# Patient Record
Sex: Male | Born: 1935 | Race: White | Hispanic: No | Marital: Married
Health system: Southern US, Community
[De-identification: ages and names within clinical notes are randomized; demographics above are authoritative.]

---

## 2006-11-08 ENCOUNTER — Ambulatory Visit: Payer: Self-pay | Admitting: Internal Medicine

## 2007-11-20 ENCOUNTER — Other Ambulatory Visit: Payer: Self-pay

## 2007-11-20 ENCOUNTER — Ambulatory Visit: Payer: Self-pay | Admitting: Family Medicine

## 2007-11-28 ENCOUNTER — Ambulatory Visit: Payer: Self-pay

## 2008-11-14 ENCOUNTER — Ambulatory Visit: Payer: Self-pay | Admitting: Internal Medicine

## 2009-04-09 ENCOUNTER — Encounter: Payer: Self-pay | Admitting: Cardiovascular Disease

## 2009-04-22 ENCOUNTER — Encounter: Payer: Self-pay | Admitting: Cardiovascular Disease

## 2009-05-23 ENCOUNTER — Encounter: Payer: Self-pay | Admitting: Cardiovascular Disease

## 2009-06-23 ENCOUNTER — Encounter: Payer: Self-pay | Admitting: Cardiovascular Disease

## 2009-07-21 ENCOUNTER — Encounter: Payer: Self-pay | Admitting: Cardiovascular Disease

## 2009-08-21 ENCOUNTER — Encounter: Payer: Self-pay | Admitting: Cardiovascular Disease

## 2009-09-20 ENCOUNTER — Encounter: Payer: Self-pay | Admitting: Cardiovascular Disease

## 2009-10-21 ENCOUNTER — Encounter: Payer: Self-pay | Admitting: Cardiovascular Disease

## 2009-11-20 ENCOUNTER — Encounter: Payer: Self-pay | Admitting: Cardiovascular Disease

## 2009-12-10 ENCOUNTER — Ambulatory Visit: Payer: Self-pay | Admitting: Family Medicine

## 2009-12-21 ENCOUNTER — Encounter: Payer: Self-pay | Admitting: Cardiovascular Disease

## 2010-01-03 ENCOUNTER — Emergency Department: Payer: Self-pay | Admitting: Unknown Physician Specialty

## 2010-02-01 ENCOUNTER — Encounter: Payer: Self-pay | Admitting: Cardiovascular Disease

## 2010-02-20 ENCOUNTER — Encounter: Payer: Self-pay | Admitting: Cardiovascular Disease

## 2010-03-23 ENCOUNTER — Encounter: Payer: Self-pay | Admitting: Cardiovascular Disease

## 2010-04-22 ENCOUNTER — Encounter: Payer: Self-pay | Admitting: Cardiovascular Disease

## 2010-05-23 ENCOUNTER — Encounter: Payer: Self-pay | Admitting: Cardiovascular Disease

## 2010-06-23 ENCOUNTER — Encounter: Payer: Self-pay | Admitting: Cardiovascular Disease

## 2010-07-22 ENCOUNTER — Encounter: Payer: Self-pay | Admitting: Cardiovascular Disease

## 2010-08-22 ENCOUNTER — Encounter: Payer: Self-pay | Admitting: Cardiovascular Disease

## 2010-10-07 ENCOUNTER — Encounter: Payer: Self-pay | Admitting: Cardiovascular Disease

## 2010-10-22 ENCOUNTER — Encounter: Payer: Self-pay | Admitting: Cardiovascular Disease

## 2010-11-19 ENCOUNTER — Ambulatory Visit: Payer: Self-pay | Admitting: Internal Medicine

## 2010-11-21 ENCOUNTER — Encounter: Payer: Self-pay | Admitting: Cardiovascular Disease

## 2010-12-22 ENCOUNTER — Encounter: Payer: Self-pay | Admitting: Cardiovascular Disease

## 2011-01-22 ENCOUNTER — Encounter: Payer: Self-pay | Admitting: Cardiovascular Disease

## 2011-02-21 ENCOUNTER — Encounter: Payer: Self-pay | Admitting: Cardiovascular Disease

## 2011-04-04 ENCOUNTER — Encounter: Payer: Self-pay | Admitting: Cardiovascular Disease

## 2011-04-23 ENCOUNTER — Ambulatory Visit: Payer: Self-pay | Admitting: Internal Medicine

## 2011-04-23 ENCOUNTER — Encounter: Payer: Self-pay | Admitting: Cardiovascular Disease

## 2011-05-11 ENCOUNTER — Inpatient Hospital Stay: Payer: Self-pay | Admitting: Student

## 2011-05-16 ENCOUNTER — Ambulatory Visit: Payer: Self-pay | Admitting: Internal Medicine

## 2011-05-24 ENCOUNTER — Ambulatory Visit: Payer: Self-pay | Admitting: Internal Medicine

## 2011-05-26 LAB — PROTIME-INR
INR: 2.2
Prothrombin Time: 24.2 secs — ABNORMAL HIGH (ref 11.5–14.7)

## 2011-05-31 LAB — CBC CANCER CENTER
Basophil #: 0 x10 3/mm (ref 0.0–0.1)
Eosinophil #: 0.1 x10 3/mm (ref 0.0–0.7)
HCT: 48.8 % (ref 40.0–52.0)
Lymphocyte #: 2.2 x10 3/mm (ref 1.0–3.6)
Lymphocyte %: 39.8 %
MCH: 31.8 pg (ref 26.0–34.0)
MCHC: 32.5 g/dL (ref 32.0–36.0)
MCV: 97.5 fL (ref 80–100)
Monocyte #: 0.6 x10 3/mm (ref 0.0–0.7)
Monocyte %: 10.5 %
Neutrophil #: 2.6 x10 3/mm (ref 1.4–6.5)
RDW: 14.3 % (ref 11.5–14.5)
WBC: 5.5 x10 3/mm (ref 3.8–10.6)

## 2011-05-31 LAB — PROTIME-INR: Prothrombin Time: 21.3 secs — ABNORMAL HIGH (ref 11.5–14.7)

## 2011-06-06 LAB — PROTIME-INR
INR: 2.3
Prothrombin Time: 25.6 secs — ABNORMAL HIGH (ref 11.5–14.7)

## 2011-06-24 ENCOUNTER — Ambulatory Visit: Payer: Self-pay | Admitting: Internal Medicine

## 2011-06-28 ENCOUNTER — Ambulatory Visit: Payer: Self-pay | Admitting: Internal Medicine

## 2011-06-28 LAB — PROTIME-INR
INR: 2.4
Prothrombin Time: 26.1 secs — ABNORMAL HIGH (ref 11.5–14.7)

## 2011-07-14 ENCOUNTER — Encounter: Payer: Self-pay | Admitting: Cardiovascular Disease

## 2011-07-14 LAB — PROTIME-INR: Prothrombin Time: 28.1 secs — ABNORMAL HIGH (ref 11.5–14.7)

## 2011-07-22 ENCOUNTER — Ambulatory Visit: Payer: Self-pay | Admitting: Internal Medicine

## 2011-07-26 ENCOUNTER — Encounter: Payer: Self-pay | Admitting: Cardiovascular Disease

## 2011-07-26 LAB — PROTIME-INR
INR: 2.3
Prothrombin Time: 25.7 secs — ABNORMAL HIGH (ref 11.5–14.7)

## 2011-07-26 LAB — CBC CANCER CENTER
Basophil %: 0.6 %
Eosinophil #: 0.1 x10 3/mm (ref 0.0–0.7)
Eosinophil %: 1.7 %
HCT: 47.5 % (ref 40.0–52.0)
HGB: 15.4 g/dL (ref 13.0–18.0)
Lymphocyte %: 32.1 %
MCHC: 32.4 g/dL (ref 32.0–36.0)
Monocyte #: 0.4 x10 3/mm (ref 0.0–0.7)
Neutrophil #: 2.1 x10 3/mm (ref 1.4–6.5)
Neutrophil %: 56.5 %
Platelet: 120 x10 3/mm — ABNORMAL LOW (ref 150–440)
RBC: 4.83 10*6/uL (ref 4.40–5.90)
WBC: 3.9 x10 3/mm (ref 3.8–10.6)

## 2011-08-22 ENCOUNTER — Encounter: Payer: Self-pay | Admitting: Cardiovascular Disease

## 2011-08-22 ENCOUNTER — Ambulatory Visit: Payer: Self-pay | Admitting: Internal Medicine

## 2011-09-20 LAB — CANCER CTR PLATELET CT: Platelet: 96 x10 3/mm — ABNORMAL LOW (ref 150–440)

## 2011-09-20 LAB — PROTIME-INR: INR: 1.9

## 2011-09-21 ENCOUNTER — Encounter: Payer: Self-pay | Admitting: Cardiovascular Disease

## 2011-09-21 ENCOUNTER — Ambulatory Visit: Payer: Self-pay | Admitting: Internal Medicine

## 2011-10-22 ENCOUNTER — Ambulatory Visit: Payer: Self-pay | Admitting: Internal Medicine

## 2011-10-26 ENCOUNTER — Ambulatory Visit: Payer: Self-pay | Admitting: Internal Medicine

## 2011-10-26 ENCOUNTER — Encounter: Payer: Self-pay | Admitting: Cardiovascular Disease

## 2011-10-26 LAB — PROTIME-INR: INR: 2.3

## 2011-11-21 ENCOUNTER — Ambulatory Visit: Payer: Self-pay | Admitting: Internal Medicine

## 2011-11-21 LAB — URINALYSIS, COMPLETE
Bacteria: NONE SEEN
Bilirubin,UR: NEGATIVE
Blood: NEGATIVE
Leukocyte Esterase: NEGATIVE
Nitrite: NEGATIVE
Ph: 5 (ref 4.5–8.0)
Protein: 30
RBC,UR: 1 /HPF (ref 0–5)
Specific Gravity: 1.015 (ref 1.003–1.030)
Squamous Epithelial: <1
WBC UR: 1 /HPF (ref 0–5)

## 2011-11-22 ENCOUNTER — Inpatient Hospital Stay: Payer: Self-pay | Admitting: Internal Medicine

## 2011-11-22 LAB — CBC
HCT: 43.4 % (ref 40.0–52.0)
MCH: 32.3 pg (ref 26.0–34.0)
MCHC: 32.1 g/dL (ref 32.0–36.0)
MCV: 100 fL (ref 80–100)
Platelet: 135 10*3/uL — ABNORMAL LOW (ref 150–440)
RBC: 4.32 10*6/uL — ABNORMAL LOW (ref 4.40–5.90)
WBC: 13.6 10*3/uL — ABNORMAL HIGH (ref 3.8–10.6)

## 2011-11-22 LAB — COMPREHENSIVE METABOLIC PANEL
Albumin: 2.9 g/dL — ABNORMAL LOW (ref 3.4–5.0)
Alkaline Phosphatase: 98 U/L (ref 50–136)
Anion Gap: 7 (ref 7–16)
BUN: 31 mg/dL — ABNORMAL HIGH (ref 7–18)
Bilirubin,Total: 1 mg/dL (ref 0.2–1.0)
Chloride: 101 mmol/L (ref 98–107)
Creatinine: 1.75 mg/dL — ABNORMAL HIGH (ref 0.60–1.30)
EGFR (African American): 40 — ABNORMAL LOW
Glucose: 165 mg/dL — ABNORMAL HIGH (ref 65–99)
Osmolality: 284 (ref 275–301)
Potassium: 5.4 mmol/L — ABNORMAL HIGH (ref 3.5–5.1)
Sodium: 137 mmol/L (ref 136–145)
Total Protein: 6.7 g/dL (ref 6.4–8.2)

## 2011-11-22 LAB — PROTIME-INR
INR: 3.5
INR: 3.6
Prothrombin Time: 34.9 secs — ABNORMAL HIGH (ref 11.5–14.7)
Prothrombin Time: 36.2 secs — ABNORMAL HIGH (ref 11.5–14.7)

## 2011-11-22 LAB — CK TOTAL AND CKMB (NOT AT ARMC)
CK, Total: 29 U/L — ABNORMAL LOW (ref 35–232)
CK-MB: 1.9 ng/mL (ref 0.5–3.6)

## 2011-11-22 LAB — DIGOXIN LEVEL: Digoxin: 0.8 ng/mL

## 2011-11-22 LAB — APTT: Activated PTT: 44.2 secs — ABNORMAL HIGH (ref 23.6–35.9)

## 2011-11-22 LAB — TROPONIN I
Troponin-I: 0.21 ng/mL — ABNORMAL HIGH
Troponin-I: 1.66 ng/mL — ABNORMAL HIGH
Troponin-I: 1.9 ng/mL — ABNORMAL HIGH

## 2011-11-22 LAB — PRO B NATRIURETIC PEPTIDE: B-Type Natriuretic Peptide: 6800 pg/mL — ABNORMAL HIGH (ref 0–450)

## 2011-11-22 LAB — PHOSPHORUS: Phosphorus: 4 mg/dL (ref 2.5–4.9)

## 2011-11-22 LAB — MAGNESIUM: Magnesium: 1.5 mg/dL — ABNORMAL LOW

## 2011-11-23 LAB — COMPREHENSIVE METABOLIC PANEL
Albumin: 2.7 g/dL — ABNORMAL LOW (ref 3.4–5.0)
BUN: 43 mg/dL — ABNORMAL HIGH (ref 7–18)
Bilirubin,Total: 0.6 mg/dL (ref 0.2–1.0)
Chloride: 100 mmol/L (ref 98–107)
Creatinine: 1.74 mg/dL — ABNORMAL HIGH (ref 0.60–1.30)
EGFR (African American): 41 — ABNORMAL LOW
Glucose: 193 mg/dL — ABNORMAL HIGH (ref 65–99)
Potassium: 4.2 mmol/L (ref 3.5–5.1)
SGOT(AST): 50 U/L — ABNORMAL HIGH (ref 15–37)
SGPT (ALT): 22 U/L
Total Protein: 6.6 g/dL (ref 6.4–8.2)

## 2011-11-23 LAB — CBC WITH DIFFERENTIAL/PLATELET
Basophil %: 0 %
Eosinophil #: 0 10*3/uL (ref 0.0–0.7)
HGB: 13.4 g/dL (ref 13.0–18.0)
Lymphocyte #: 1 10*3/uL (ref 1.0–3.6)
Lymphocyte %: 6.6 %
MCH: 32.6 pg (ref 26.0–34.0)
MCHC: 33 g/dL (ref 32.0–36.0)
MCV: 99 fL (ref 80–100)
Monocyte #: 1 x10 3/mm (ref 0.2–1.0)
Neutrophil %: 87.2 %
Platelet: 153 10*3/uL (ref 150–440)

## 2011-11-23 LAB — MAGNESIUM: Magnesium: 2.1 mg/dL

## 2011-11-23 LAB — PROTIME-INR
INR: 3.2
Prothrombin Time: 33.1 secs — ABNORMAL HIGH (ref 11.5–14.7)

## 2011-11-24 LAB — PROTIME-INR: Prothrombin Time: 27.1 secs — ABNORMAL HIGH (ref 11.5–14.7)

## 2011-11-25 LAB — PROTIME-INR
INR: 2.2
Prothrombin Time: 24.5 secs — ABNORMAL HIGH (ref 11.5–14.7)

## 2011-11-26 LAB — VANCOMYCIN, TROUGH: Vancomycin, Trough: 9 ug/mL — ABNORMAL LOW (ref 10–20)

## 2011-11-27 LAB — MAGNESIUM: Magnesium: 1.8 mg/dL

## 2011-11-27 LAB — CBC WITH DIFFERENTIAL/PLATELET
Basophil %: 0.2 %
Eosinophil #: 0.1 10*3/uL (ref 0.0–0.7)
Eosinophil %: 2.4 %
HCT: 38.8 % — ABNORMAL LOW (ref 40.0–52.0)
HGB: 12.8 g/dL — ABNORMAL LOW (ref 13.0–18.0)
MCH: 32.8 pg (ref 26.0–34.0)
MCV: 99 fL (ref 80–100)
Monocyte #: 0.6 x10 3/mm (ref 0.2–1.0)
Monocyte %: 12.3 %
Neutrophil #: 3 10*3/uL (ref 1.4–6.5)
RBC: 3.9 10*6/uL — ABNORMAL LOW (ref 4.40–5.90)
WBC: 4.6 10*3/uL (ref 3.8–10.6)

## 2011-11-27 LAB — BASIC METABOLIC PANEL
Anion Gap: 7 (ref 7–16)
BUN: 42 mg/dL — ABNORMAL HIGH (ref 7–18)
Calcium, Total: 8.1 mg/dL — ABNORMAL LOW (ref 8.5–10.1)
Co2: 31 mmol/L (ref 21–32)
Creatinine: 1.32 mg/dL — ABNORMAL HIGH (ref 0.60–1.30)
EGFR (African American): 57 — ABNORMAL LOW
Glucose: 90 mg/dL (ref 65–99)
Potassium: 4.3 mmol/L (ref 3.5–5.1)
Sodium: 140 mmol/L (ref 136–145)

## 2011-11-28 LAB — BASIC METABOLIC PANEL
BUN: 42 mg/dL — ABNORMAL HIGH (ref 7–18)
Co2: 34 mmol/L — ABNORMAL HIGH (ref 21–32)
Creatinine: 1.34 mg/dL — ABNORMAL HIGH (ref 0.60–1.30)
EGFR (African American): 56 — ABNORMAL LOW
EGFR (Non-African Amer.): 48 — ABNORMAL LOW
Glucose: 87 mg/dL (ref 65–99)
Osmolality: 293 (ref 275–301)
Sodium: 142 mmol/L (ref 136–145)

## 2011-11-28 LAB — PRO B NATRIURETIC PEPTIDE: B-Type Natriuretic Peptide: 2778 pg/mL — ABNORMAL HIGH (ref 0–450)

## 2011-11-29 LAB — PROTIME-INR: Prothrombin Time: 18.6 secs — ABNORMAL HIGH (ref 11.5–14.7)

## 2011-11-30 LAB — BASIC METABOLIC PANEL
Anion Gap: 6 — ABNORMAL LOW (ref 7–16)
Calcium, Total: 8.1 mg/dL — ABNORMAL LOW (ref 8.5–10.1)
Chloride: 105 mmol/L (ref 98–107)
Co2: 31 mmol/L (ref 21–32)
Creatinine: 1.18 mg/dL (ref 0.60–1.30)
EGFR (African American): >60
EGFR (Non-African Amer.): 56 — ABNORMAL LOW
Glucose: 99 mg/dL (ref 65–99)
Osmolality: 292 (ref 275–301)
Sodium: 142 mmol/L (ref 136–145)

## 2011-12-01 ENCOUNTER — Encounter: Payer: Self-pay | Admitting: Cardiovascular Disease

## 2011-12-01 ENCOUNTER — Ambulatory Visit: Payer: Self-pay | Admitting: Internal Medicine

## 2011-12-22 ENCOUNTER — Ambulatory Visit: Payer: Self-pay | Admitting: Internal Medicine

## 2011-12-27 ENCOUNTER — Ambulatory Visit: Payer: Self-pay | Admitting: Internal Medicine

## 2011-12-27 LAB — CANCER CTR PLATELET CT: Platelet: 134 x10 3/mm — ABNORMAL LOW (ref 150–440)

## 2012-01-22 ENCOUNTER — Ambulatory Visit: Payer: Self-pay | Admitting: Oncology

## 2012-01-22 ENCOUNTER — Ambulatory Visit: Payer: Self-pay | Admitting: Internal Medicine

## 2012-02-06 ENCOUNTER — Inpatient Hospital Stay: Payer: Self-pay | Admitting: Specialist

## 2012-02-06 LAB — BODY FLUID CELL COUNT WITH DIFFERENTIAL
Eosinophil: 1 %
Lymphocytes: 80 %
Neutrophils: 2 %
Nucleated Cell Count: 478 /mm3

## 2012-02-06 LAB — GLUCOSE, SEROUS FLUID: Glucose, Body Fluid: 114 mg/dL

## 2012-02-06 LAB — PROTIME-INR
INR: 1.1
Prothrombin Time: 14.8 secs — ABNORMAL HIGH (ref 11.5–14.7)

## 2012-02-06 LAB — LACTATE DEHYDROGENASE, PLEURAL OR PERITONEAL FLUID: LDH, Body Fluid: 93 U/L

## 2012-02-06 LAB — PROTEIN, BODY FLUID: Protein, Body Fluid: 3.2 g/dL

## 2012-02-08 LAB — CBC WITH DIFFERENTIAL/PLATELET
Basophil #: 0 10*3/uL (ref 0.0–0.1)
Basophil %: 0.7 %
HCT: 39.7 % — ABNORMAL LOW (ref 40.0–52.0)
HGB: 13.3 g/dL (ref 13.0–18.0)
Lymphocyte %: 22.2 %
Monocyte #: 0.6 x10 3/mm (ref 0.2–1.0)
Monocyte %: 11.8 %
Neutrophil #: 3.1 10*3/uL (ref 1.4–6.5)
Neutrophil %: 63.2 %
WBC: 4.9 10*3/uL (ref 3.8–10.6)

## 2012-02-08 LAB — COMPREHENSIVE METABOLIC PANEL
Alkaline Phosphatase: 105 U/L (ref 50–136)
Anion Gap: 5 — ABNORMAL LOW (ref 7–16)
Bilirubin,Total: 0.6 mg/dL (ref 0.2–1.0)
Calcium, Total: 8.8 mg/dL (ref 8.5–10.1)
Chloride: 100 mmol/L (ref 98–107)
Co2: 34 mmol/L — ABNORMAL HIGH (ref 21–32)
Creatinine: 1.58 mg/dL — ABNORMAL HIGH (ref 0.60–1.30)
EGFR (African American): 46 — ABNORMAL LOW
EGFR (Non-African Amer.): 39 — ABNORMAL LOW
SGOT(AST): 22 U/L (ref 15–37)
SGPT (ALT): 14 U/L (ref 12–78)

## 2012-02-10 LAB — BODY FLUID CULTURE

## 2012-02-21 ENCOUNTER — Ambulatory Visit: Payer: Self-pay | Admitting: Internal Medicine

## 2012-03-01 ENCOUNTER — Ambulatory Visit: Payer: Self-pay | Admitting: Neurology

## 2012-03-21 ENCOUNTER — Inpatient Hospital Stay: Payer: Self-pay | Admitting: Internal Medicine

## 2012-03-21 LAB — COMPREHENSIVE METABOLIC PANEL
Albumin: 2.5 g/dL — ABNORMAL LOW (ref 3.4–5.0)
Alkaline Phosphatase: 97 U/L (ref 50–136)
Anion Gap: 7 (ref 7–16)
BUN: 20 mg/dL — ABNORMAL HIGH (ref 7–18)
Calcium, Total: 8.6 mg/dL (ref 8.5–10.1)
EGFR (African American): 53 — ABNORMAL LOW
EGFR (Non-African Amer.): 45 — ABNORMAL LOW
Glucose: 112 mg/dL — ABNORMAL HIGH (ref 65–99)
Osmolality: 283 (ref 275–301)
Potassium: 3.9 mmol/L (ref 3.5–5.1)
SGOT(AST): 16 U/L (ref 15–37)
Total Protein: 6.8 g/dL (ref 6.4–8.2)

## 2012-03-21 LAB — DIGOXIN LEVEL: Digoxin: 1.3 ng/mL

## 2012-03-21 LAB — URINALYSIS, COMPLETE
Glucose,UR: NEGATIVE mg/dL (ref 0–75)
Hyaline Cast: 37
Leukocyte Esterase: NEGATIVE
Nitrite: NEGATIVE
Ph: 5 (ref 4.5–8.0)
Protein: 30
RBC,UR: 27 /HPF (ref 0–5)
Specific Gravity: 1.025 (ref 1.003–1.030)
WBC UR: 11 /HPF (ref 0–5)

## 2012-03-21 LAB — CK TOTAL AND CKMB (NOT AT ARMC)
CK, Total: 39 U/L (ref 35–232)
CK-MB: 1.3 ng/mL (ref 0.5–3.6)

## 2012-03-21 LAB — CBC
HCT: 35.7 % — ABNORMAL LOW (ref 40.0–52.0)
MCH: 31.4 pg (ref 26.0–34.0)
MCV: 95 fL (ref 80–100)
Platelet: 151 10*3/uL (ref 150–440)
RBC: 3.76 10*6/uL — ABNORMAL LOW (ref 4.40–5.90)
RDW: 16 % — ABNORMAL HIGH (ref 11.5–14.5)

## 2012-03-21 LAB — PROTIME-INR
INR: 2.9
Prothrombin Time: 30.8 secs — ABNORMAL HIGH (ref 11.5–14.7)

## 2012-03-21 LAB — TROPONIN I: Troponin-I: 0.06 ng/mL — ABNORMAL HIGH

## 2012-03-22 LAB — CBC WITH DIFFERENTIAL/PLATELET
Basophil #: 0 10*3/uL (ref 0.0–0.1)
Eosinophil #: 0 10*3/uL (ref 0.0–0.7)
Lymphocyte #: 0.5 10*3/uL — ABNORMAL LOW (ref 1.0–3.6)
MCV: 94 fL (ref 80–100)
Monocyte %: 1.7 %
Neutrophil #: 3.4 10*3/uL (ref 1.4–6.5)
Platelet: 134 10*3/uL — ABNORMAL LOW (ref 150–440)
RBC: 3.39 10*6/uL — ABNORMAL LOW (ref 4.40–5.90)
RDW: 16 % — ABNORMAL HIGH (ref 11.5–14.5)
WBC: 3.9 10*3/uL (ref 3.8–10.6)

## 2012-03-22 LAB — PROTIME-INR
INR: 3.3
Prothrombin Time: 33.3 secs — ABNORMAL HIGH (ref 11.5–14.7)

## 2012-03-22 LAB — BASIC METABOLIC PANEL
Anion Gap: 6 — ABNORMAL LOW (ref 7–16)
BUN: 22 mg/dL — ABNORMAL HIGH (ref 7–18)
Chloride: 102 mmol/L (ref 98–107)
Creatinine: 1.23 mg/dL (ref 0.60–1.30)
EGFR (Non-African Amer.): 53 — ABNORMAL LOW
Glucose: 148 mg/dL — ABNORMAL HIGH (ref 65–99)
Osmolality: 287 (ref 275–301)

## 2012-03-22 LAB — LIPID PANEL
Cholesterol: 115 mg/dL (ref 0–200)
HDL Cholesterol: 53 mg/dL (ref 40–60)
Ldl Cholesterol, Calc: 48 mg/dL (ref 0–100)
Triglycerides: 70 mg/dL (ref 0–200)
VLDL Cholesterol, Calc: 14 mg/dL (ref 5–40)

## 2012-03-23 ENCOUNTER — Ambulatory Visit: Payer: Self-pay | Admitting: Internal Medicine

## 2012-03-24 LAB — PROTIME-INR
INR: 3.4
Prothrombin Time: 34.2 secs — ABNORMAL HIGH (ref 11.5–14.7)

## 2012-03-25 LAB — BASIC METABOLIC PANEL
Anion Gap: 10 (ref 7–16)
BUN: 41 mg/dL — ABNORMAL HIGH (ref 7–18)
Chloride: 101 mmol/L (ref 98–107)
Co2: 32 mmol/L (ref 21–32)
Osmolality: 299 (ref 275–301)
Potassium: 4.2 mmol/L (ref 3.5–5.1)
Sodium: 143 mmol/L (ref 136–145)

## 2012-03-25 LAB — CBC WITH DIFFERENTIAL/PLATELET
Basophil #: 0 10*3/uL (ref 0.0–0.1)
Basophil %: 0 %
Eosinophil #: 0 10*3/uL (ref 0.0–0.7)
Eosinophil %: 0 %
HGB: 10.7 g/dL — ABNORMAL LOW (ref 13.0–18.0)
Lymphocyte %: 2.8 %
MCH: 31.5 pg (ref 26.0–34.0)
MCHC: 33.5 g/dL (ref 32.0–36.0)
Monocyte #: 0.1 x10 3/mm — ABNORMAL LOW (ref 0.2–1.0)
Neutrophil %: 95.1 %
Platelet: 147 10*3/uL — ABNORMAL LOW (ref 150–440)
RDW: 15.7 % — ABNORMAL HIGH (ref 11.5–14.5)
WBC: 6.6 10*3/uL (ref 3.8–10.6)

## 2012-03-25 LAB — PROTIME-INR: Prothrombin Time: 29.1 secs — ABNORMAL HIGH (ref 11.5–14.7)

## 2012-03-26 LAB — CBC WITH DIFFERENTIAL/PLATELET
Basophil #: 0 10*3/uL (ref 0.0–0.1)
Basophil %: 0.1 %
HCT: 31.9 % — ABNORMAL LOW (ref 40.0–52.0)
Lymphocyte #: 0.2 10*3/uL — ABNORMAL LOW (ref 1.0–3.6)
Lymphocyte %: 3.2 %
MCH: 31.4 pg (ref 26.0–34.0)
MCHC: 33.3 g/dL (ref 32.0–36.0)
Monocyte #: 0.1 x10 3/mm — ABNORMAL LOW (ref 0.2–1.0)
Neutrophil #: 5.9 10*3/uL (ref 1.4–6.5)
Neutrophil %: 94.6 %
Platelet: 136 10*3/uL — ABNORMAL LOW (ref 150–440)
RDW: 16.1 % — ABNORMAL HIGH (ref 11.5–14.5)

## 2012-03-26 LAB — BASIC METABOLIC PANEL
Anion Gap: 9 (ref 7–16)
Calcium, Total: 8 mg/dL — ABNORMAL LOW (ref 8.5–10.1)
Chloride: 100 mmol/L (ref 98–107)
EGFR (Non-African Amer.): 42 — ABNORMAL LOW
Glucose: 165 mg/dL — ABNORMAL HIGH (ref 65–99)
Osmolality: 301 (ref 275–301)
Sodium: 143 mmol/L (ref 136–145)

## 2012-03-27 LAB — CULTURE, BLOOD (SINGLE)

## 2012-04-22 ENCOUNTER — Ambulatory Visit: Payer: Self-pay | Admitting: Internal Medicine

## 2012-04-22 DEATH — deceased

## 2012-08-11 IMAGING — CR DG CHEST 1V PORT
1 series · 1 of 1 positions shown · non-contrast
Comparison: none

REASON FOR EXAM: sob
COMMENTS:

[portable]
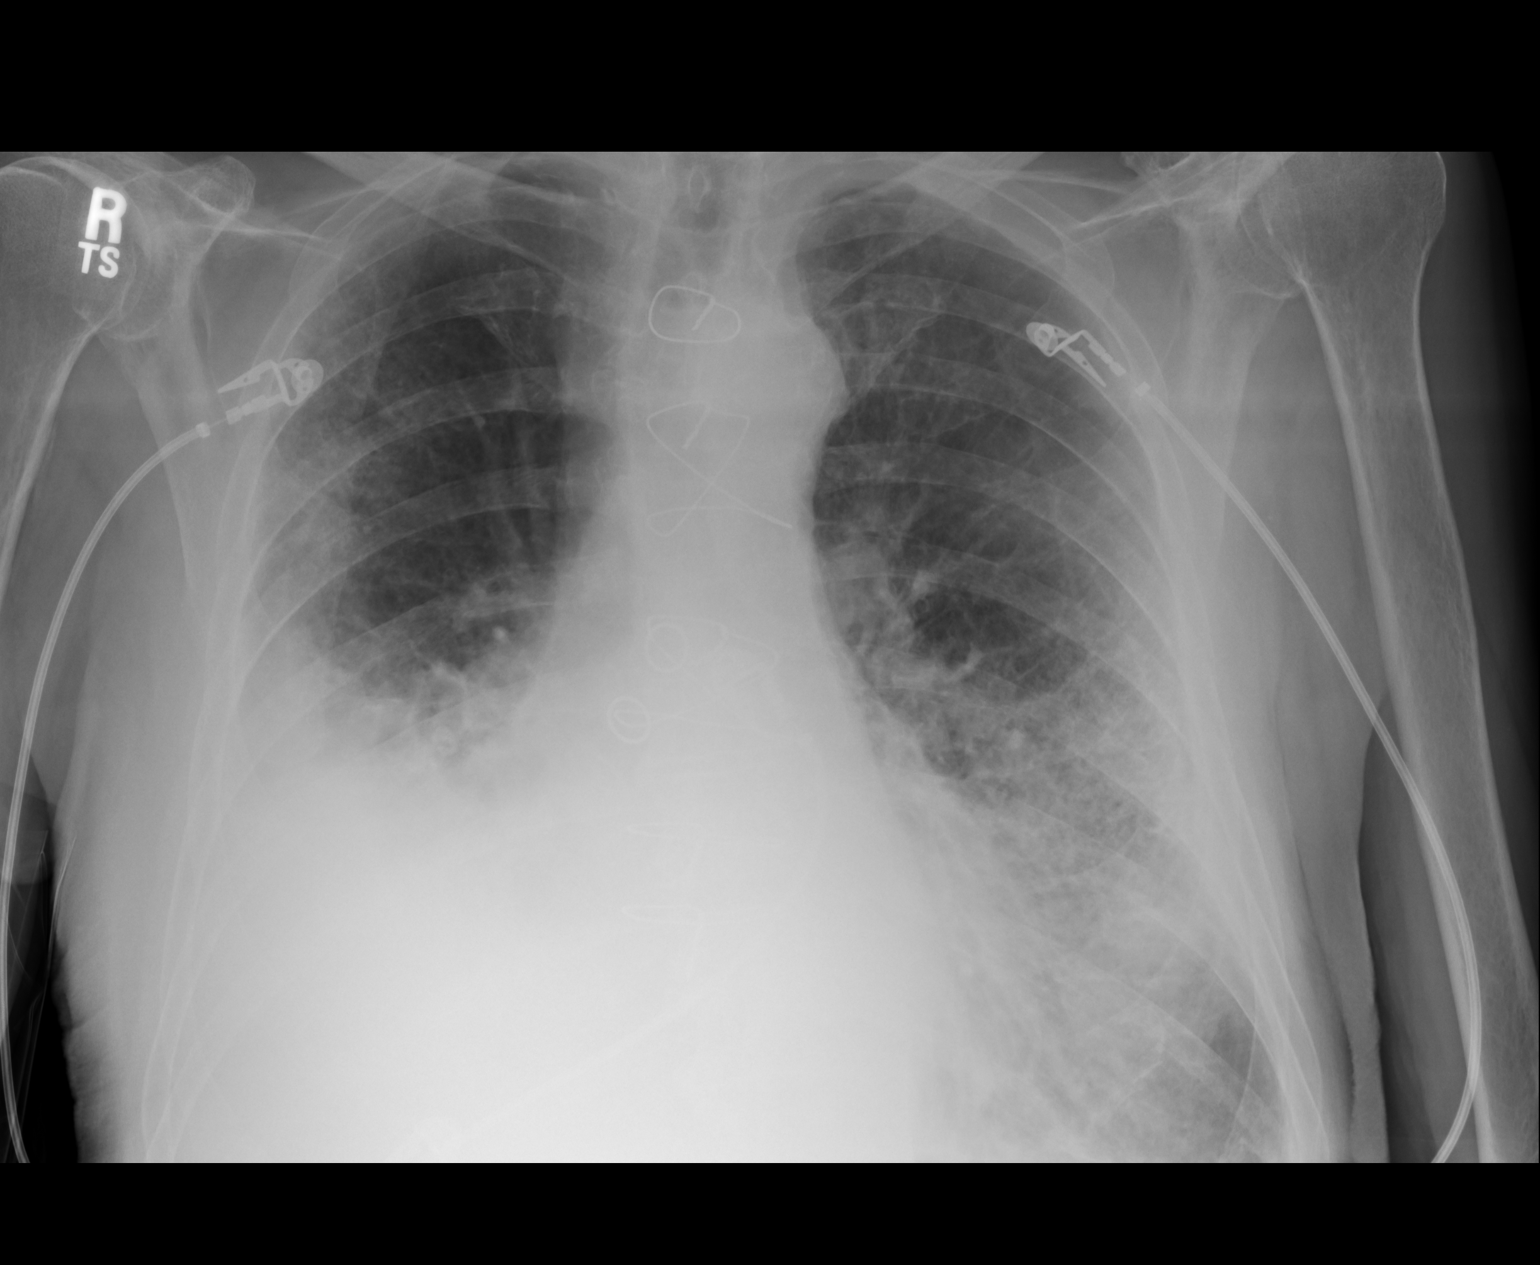

[1 of 1 positions shown; findings below may reference images not displayed]

PROCEDURE:     DXR - DXR PORTABLE CHEST SINGLE VIEW  - November 21, 2011 [DATE]

RESULT:     Frontal view of the chest is performed. Comparison is made to a
prior two view chest exam dated 10/17/2011.

Persistent consolidative density projects in the region of the right middle
lobe and right lung base. There has been interval development of increased
density within the region of the right middle lobe. The most inferior
portion of the lung bases is not included on the study. An area of
ill-defined increased density projects in the region of the right upper
lobe. There is prominence of the interstitial markings and peribronchial
cuffing. The cardiac silhouette is within normal limits. The visualized bony
skeleton is unremarkable.
IMPRESSION: 1.  Interstitial infiltrate, edematous versus infectious or inflammatory.
2.  Asymmetric edema versus infiltrates within the lung bases and possible
right effusion. Surveillance evaluation is recommended status post
appropriate therapeutic regiment.

## 2012-08-16 IMAGING — CR DG CHEST 1V
1 series · 1 of 1 positions shown · non-contrast
Comparison: none

REASON FOR EXAM: leukocytosis, r/o PNA
COMMENTS:

PROCEDURE:     DXR - DXR CHEST 1 VIEWAP OR PA  - November 26, 2011 [DATE]
RESULT:     Comparison: 11/21/2011

[ap]
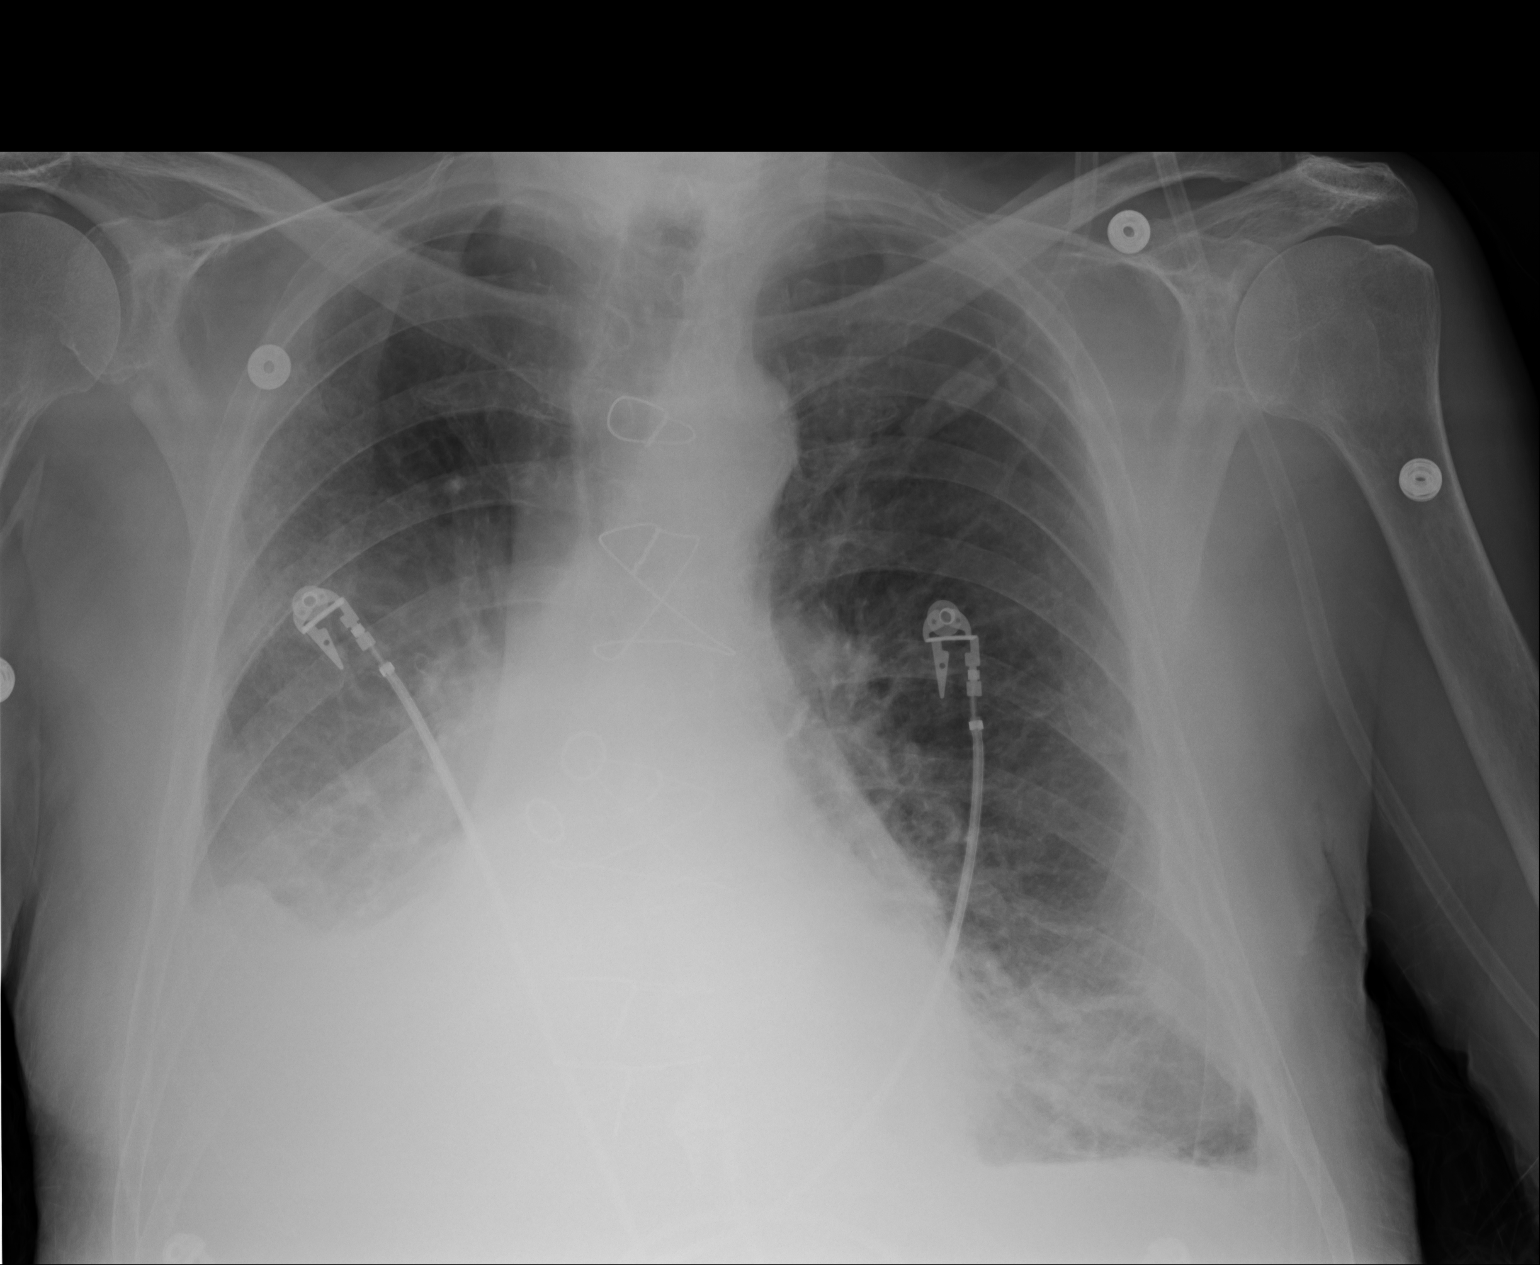

[1 of 1 positions shown; findings below may reference images not displayed]

FINDINGS: Single portable AP chest radiograph is provided. There is right lower lobe
airspace disease. There bilateral trace pleural effusions. There is hazy
left lower lobe airspace disease. There is bilateral interstitial
thickening. There is evidence of prior CABG. The heart size is normal. The
osseous structures are unremarkable.
IMPRESSION: Bibasilar airspace disease and bilateral small pleural effusions, right
greater than left. There is bilateral interstitial evening. The overall
appearance may be secondary to pulmonary edema versus multilobar pneumonia.

[REDACTED]

## 2012-10-27 IMAGING — XA IR PERC PLEURAL DRAIN W/INDWELL CATH W/IMG GUIDE
1 series · 2 of 2 positions shown · non-contrast
Comparison: none

REASON FOR EXAM: Right sided chest tube placement for penoumothorax
COMMENTS:

[Series 1: run · 2 of 2 slices shown]
[im 1/2]
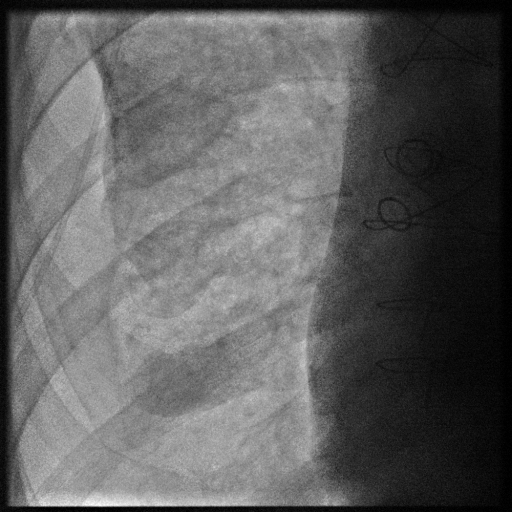
[im 2/2]
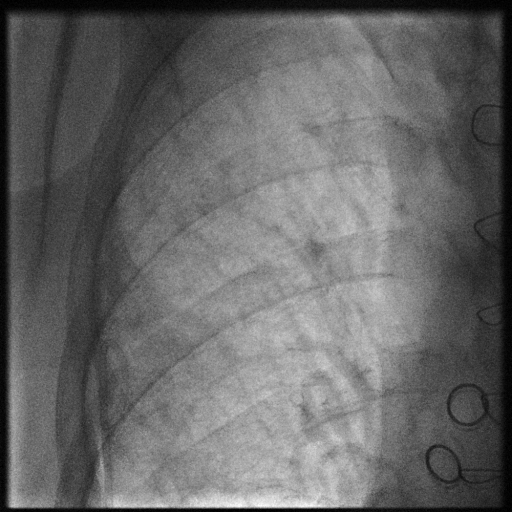

[2 of 2 positions shown; findings below may reference images not displayed]

PROCEDURE:     VAS - VAS TUBE INST CHEST RT VIA FLUOR  - February 06, 2012  [DATE]

RESULT:     After discussing the risk and benefits with the patient informed
consent was obtained. The right chest tube sterilely prepped and draped.
Following administration of 1% lidocaine , 18-gauge needle was advanced into
the pleural space , Amplatz extra-stiff wire placed and a [DATE] French pigtail
chest tube placed without complication. Chest tube placed to a low wall
suction.
IMPRESSION: Successful right chest tube placement.

## 2012-10-29 IMAGING — CR DG CHEST 1V PORT
1 series · 1 of 1 positions shown · non-contrast
Comparison: none

REASON FOR EXAM: follow up pneumothorax
COMMENTS:

[ap]
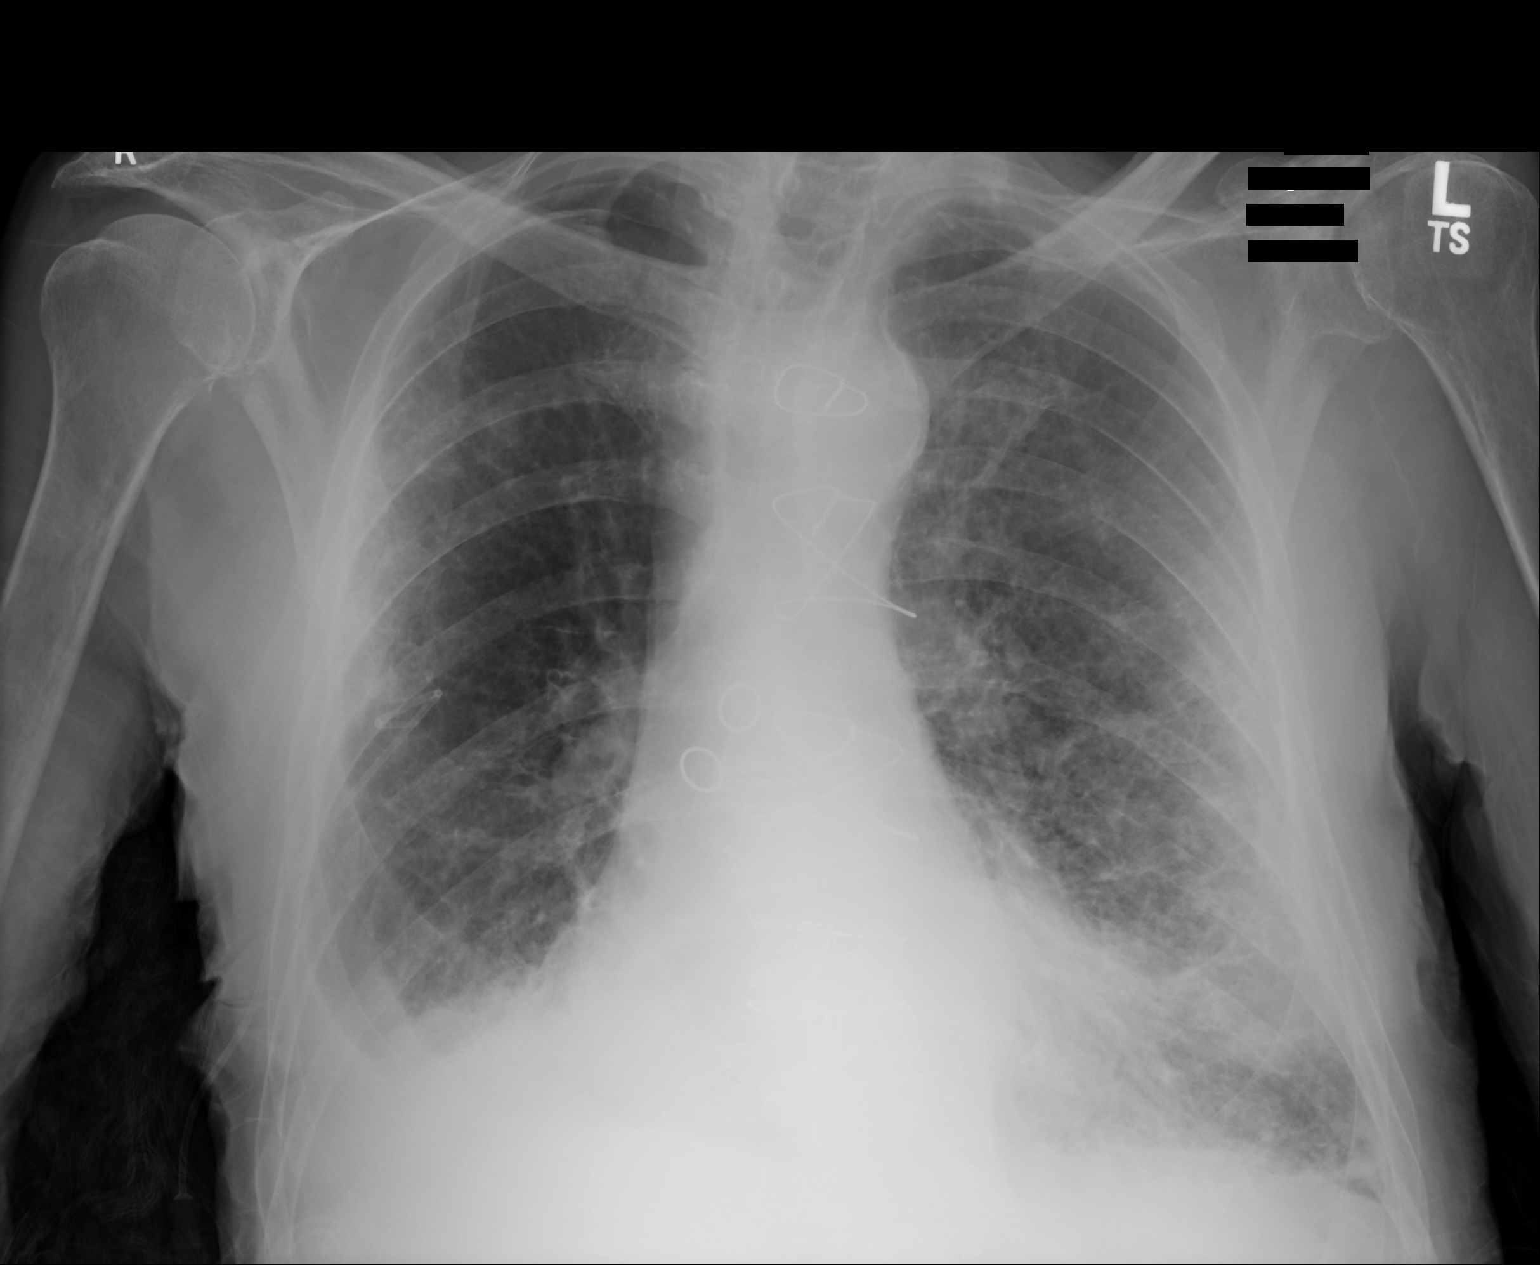

[1 of 1 positions shown; findings below may reference images not displayed]

PROCEDURE:     DXR - DXR PORTABLE CHEST SINGLE VIEW  - February 08, 2012  [DATE]

RESULT:     Comparison is made to the study 07 February, 2012.

Persistent patchy increased density is seen in the mid and lower left lung
regions and at the right lung base. There is pleural thickening laterally in
the right hemithorax. The patient is rotated toward the left. Sternotomy
wires are present. CABG markers are demonstrated. The heart size appears
normal. Small effusions are present. Small caliber right-sided chest tube is
present. Small apical pneumothorax is present on the right.
IMPRESSION: 1. The right chest tube remains in place with a small right apical
pneumothorax persisting. Other pulmonary changes appear to be stable.

[REDACTED]

## 2012-12-10 IMAGING — CR DG CHEST 1V PORT
1 series · 2 of 2 positions shown · non-contrast
Comparison: none

REASON FOR EXAM: dyspnea
COMMENTS:

PROCEDURE:     DXR - DXR PORTABLE CHEST SINGLE VIEW  - March 21, 2012  [DATE]
RESULT:     Comparison is made to a prior study dated 02/09/2012.

[Series 1: ap · 0.17mm/px · 2 of 2 slices shown]
[im 1/2]
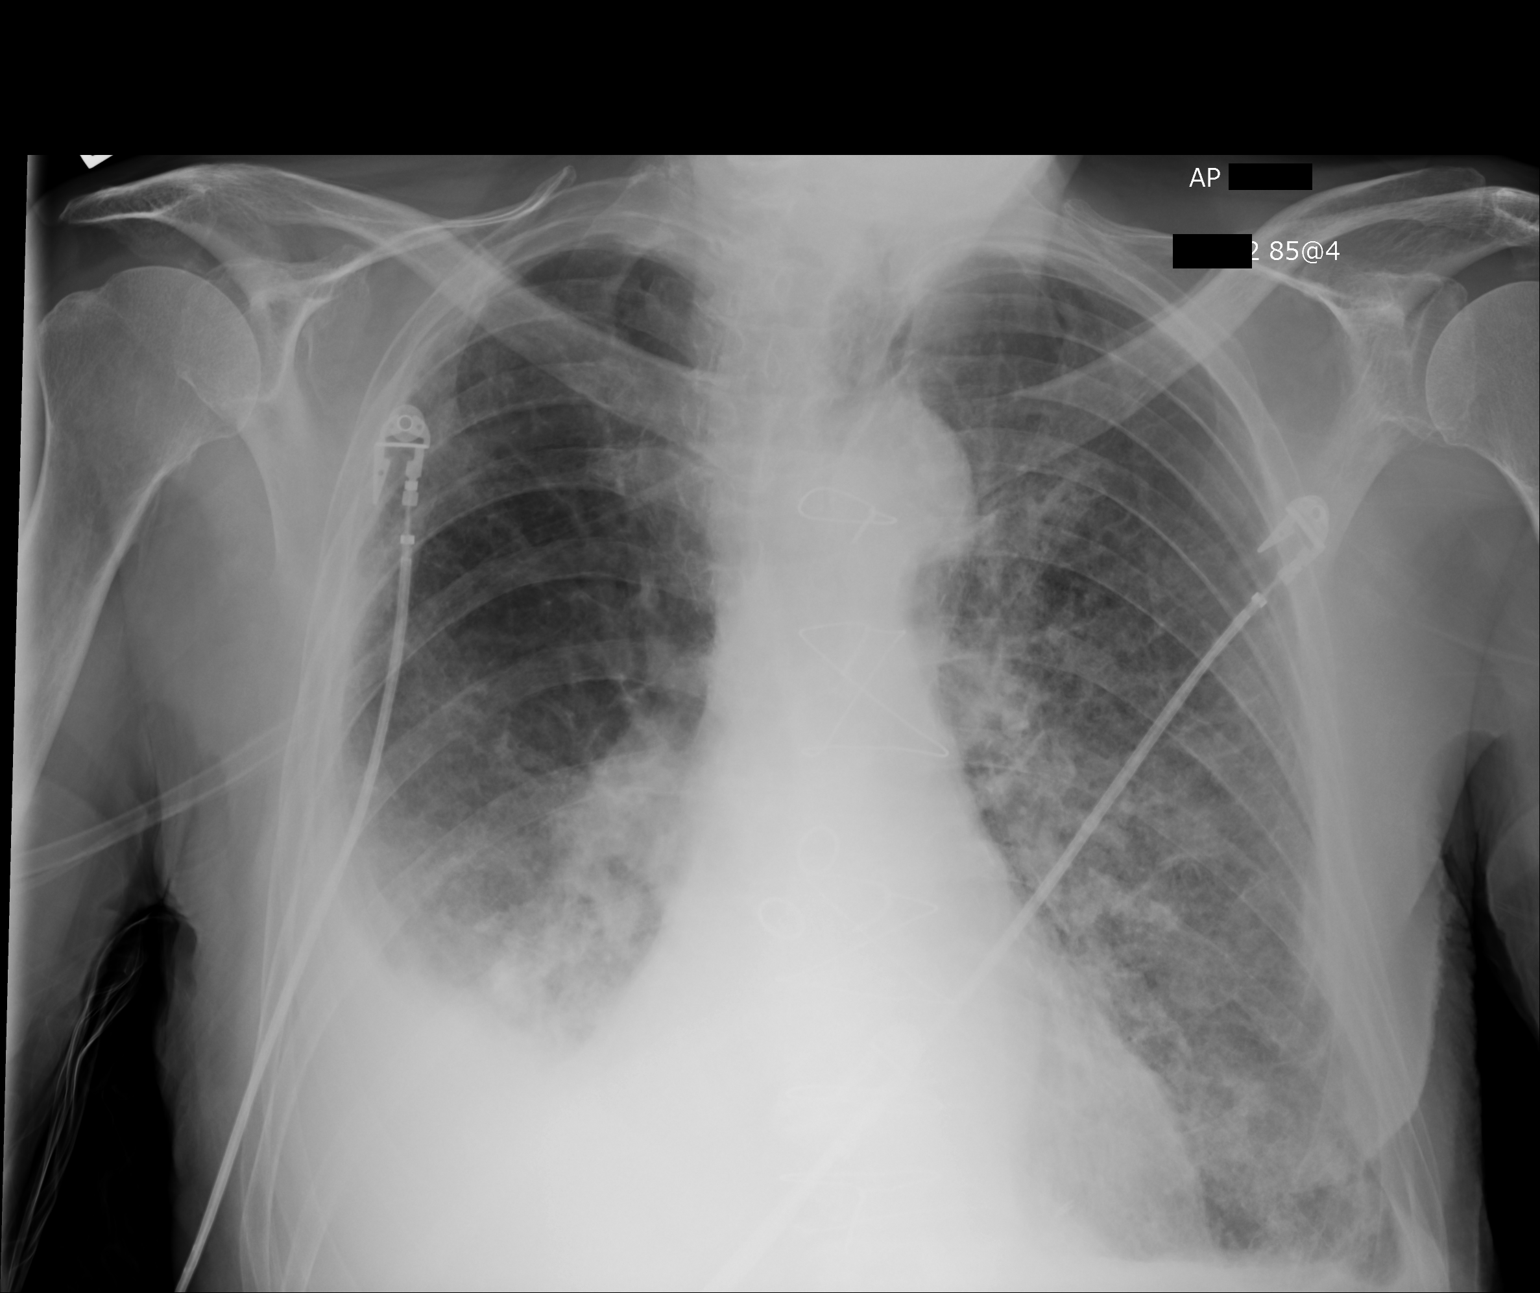
[im 2/2]
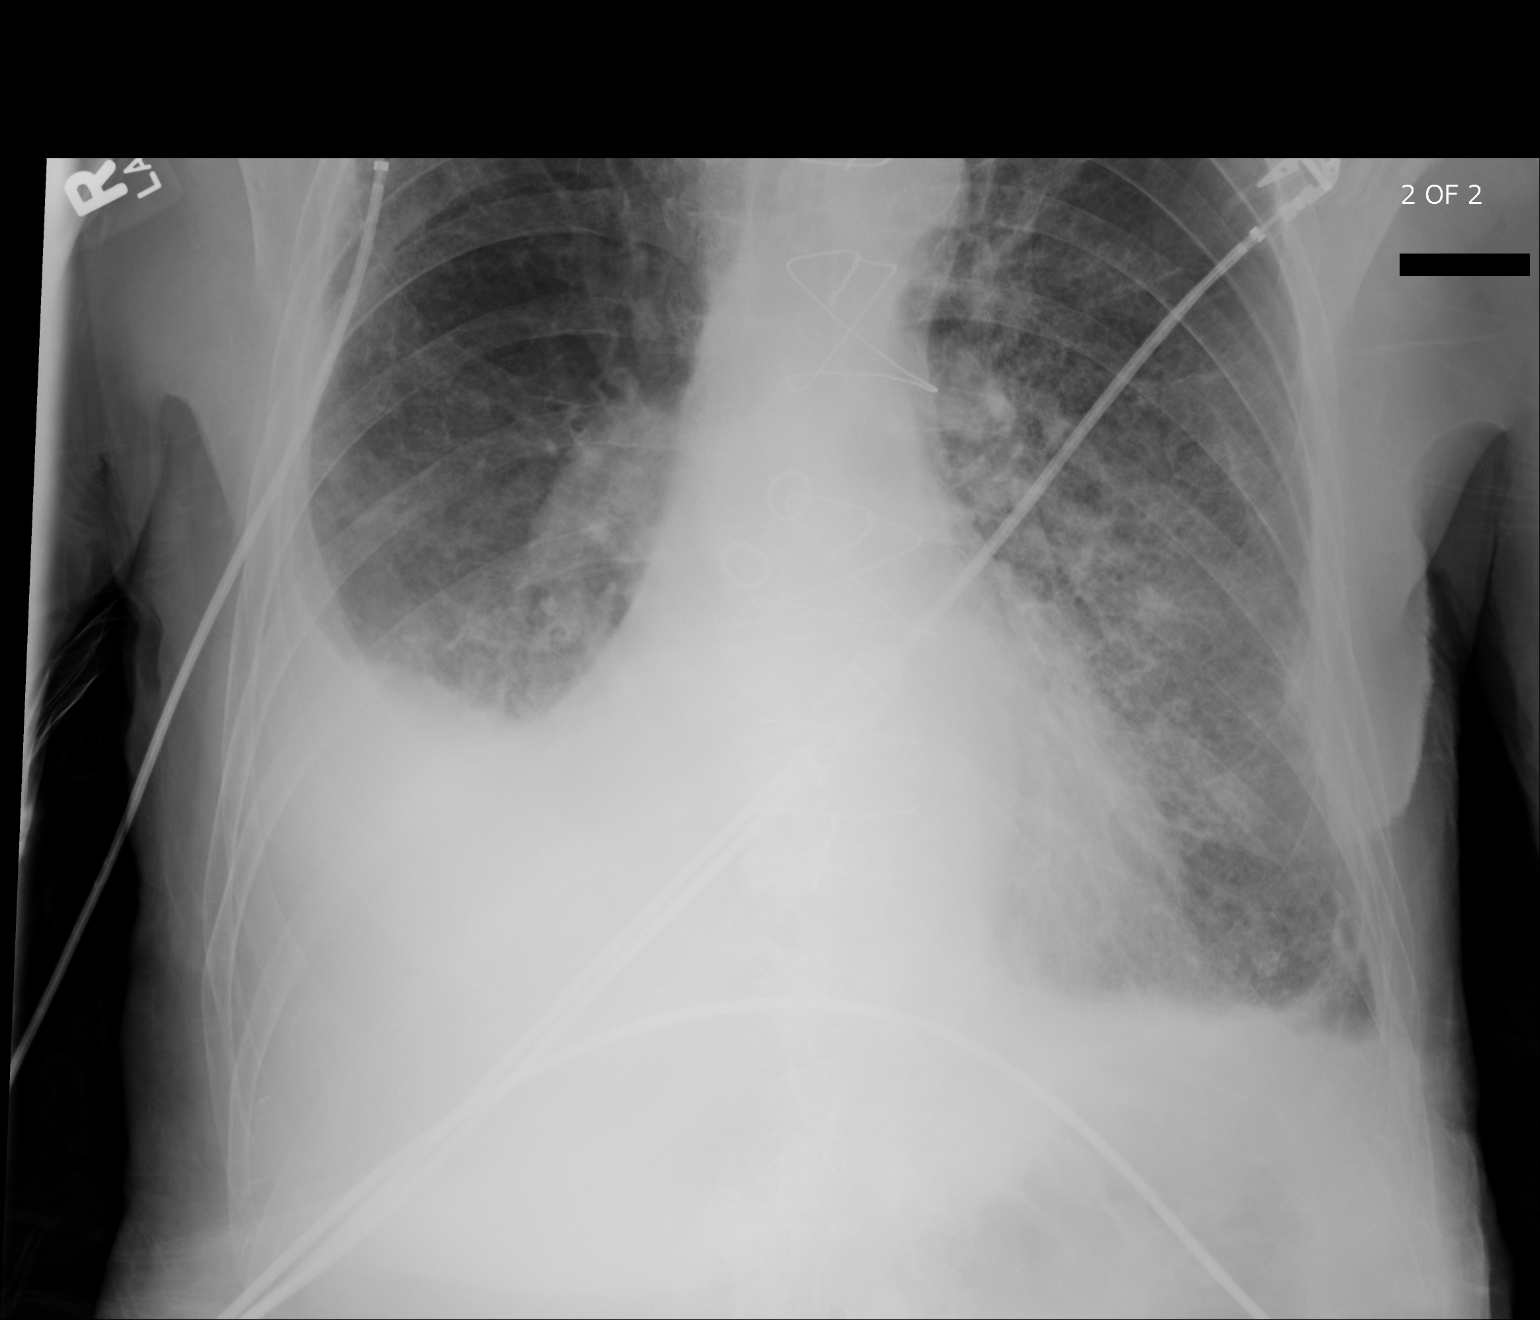

[2 of 2 positions shown; findings below may reference images not displayed]

FINDINGS: There is prominence and indistinctness of the interstitial
markings and peribronchial cuffing. An area of consolidative density
projects in the right lung base. The cardiac silhouette is moderately
enlarged. The visualized bony skeleton is unremarkable.
IMPRESSION: 1.  Interstitial infiltrate, edematous versus nonedematous.
2.  Atelectasis versus consolidative infiltrate or possibly effusion, right
lung base. An underlying mass or nodule cannot be excluded. Surveillance
evaluation is recommended.

## 2014-09-09 NOTE — Consult Note (Signed)
PATIENT NAME:  John [name], [name] MR#:  [id number] DATE OF BIRTH:  05-11-1925  DATE OF CONSULTATION:  02/06/2012  REFERRING PHYSICIAN:   CONSULTING PHYSICIAN:  Adella HareJ. Wilton Smith, MD  HISTORY OF PRESENT ILLNESS: This 81 year old male has a chief complaint of a collapsed lung. He has a history of congestive heart failure and recent development of a large right pleural effusion. He came in for Dr. Register to do an ultrasound-guided thoracentesis. The patient reports that approximately 2 liters of fluid were drained. Dr. Register reports that he does not think the needle stuck into the lung, but the patient did develop a postoperative pneumothorax and Dr. Register inserted a pigtail catheter which almost completely reexpanded the lung and the patient was advised to go into the hospital for at least overnight observation. Surgery consultation was requested by Dr. Meredeth IdeFleming and by Dr. Rosalene Billingsegister.   PAST MEDICAL HISTORY: Chronic obstructive pulmonary disease and history of congestive heart failure.   MEDICATIONS:  1. Advair.  2. Albuterol/ipratropium. 3. Alprazolam.  4. Amiodarone.  5. Ceftin.  6. Digoxin.  7. Lasix.  8. Metoprolol.  9. Tylenol.  10. Warfarin   ALLERGIES: No known allergies.  REVIEW OF SYSTEMS:  He does report some recent increased difficulty breathing, some dyspnea on exertion, and he is reporting minimal chest discomfort at the site on the right side.   PHYSICAL EXAMINATION:  GENERAL: He is awake, alert, and oriented, appears to be in no acute distress. He does have a dressing on the right side of his chest and also another in the anterior infrascapular region. The 1 x 1 dressing in the infrascapular region appears dry. There is pigtail catheter with dressing at the right lower lateral ribcage with dressing intact. This is connected to Pleur-evac suction and there does appear to be an air leak. There is only a trace of fluid in the fluid collection chamber. He appears to be breathing  easily.   VITAL SIGNS: Temperature 97.6, pulse 82, respirations 22, blood pressure 114/68, oxygen saturation 92%.   SKIN: Warm and dry.   LUNGS: Lung sounds with few coarse rhonchi right base. Air entry good. He has some moderate kyphosis.   IMPRESSION:  Right pleural effusion and postoperative right pneumothorax.   PLAN: Continue with the pigtail catheter and Pleur-evac suction. Repeat a chest film tomorrow. We will need to keep the catheter in until air leak seals. I will plan to re-examine him tomorrow as well. This was discussed with Dr. Register, Dr. Meredeth IdeFleming, the patient, and his wife.    ____________________________ Shela CommonsJ. Renda RollsWilton Smith, MD jws:bjt D: 02/06/2012 17:59:27 ET T: 02/07/2012 09:49:00 ET JOB#: [id number]  cc: Adella HareJ. Wilton Smith, MD, <Dictator> Adella HareWILTON J SMITH MD ELECTRONICALLY SIGNED 02/09/2012 18:23

## 2014-09-09 NOTE — Discharge Summary (Signed)
PATIENT NAME:  John [name], John [name] MR#:  [id number] DATE OF BIRTH:  1925-03-24  DATE OF ADMISSION:  03/21/2012 DATE OF DISCHARGE:  03/26/2012  ADMITTING DIAGNOSES:  1. Acute on chronic respiratory failure.  2. Congestive heart failure, left heart, acute on chronic, diastolic.  3. Suspected bacterial pneumonia.  4. Right pleural effusion.  5. Lung fibrosis.  6. Hypertension.  7. CKD.  8. Atrial fibrillation, chronic Coumadin therapy.  9. Peripheral vascular disease.  10. Failure to thrive, adult.  11. Oral candidiasis.  12. History of coronary artery disease, coronary artery bypass grafting. 13. Chronic obstructive pulmonary disease, chronic respiratory failure on 4 liters of oxygen at home.  14. Lung mass. 15. Irritable bowel syndrome.  16. Thrombocytopenia.   DISCHARGE CONDITION: Guarded.   DISCHARGE MEDICATIONS: The patient is to continue digoxin 125 mcg p.o. on Mondays, Wednesdays, Fridays. Combivent Respimat 1 puff every six hours, Tylenol 500 mg 3 times daily as needed. Maxitrol ophthalmic ointment one application to each affected eye every other day, vitamin C 500 mg p.o. twice daily, xanax 0.5 mg p.o. twice daily, warfarin 1 mg p.o. on Mondays, Tuesdays, Thursdays, Fridays as well as Sundays, zinc 50 mg once daily.  Prednisone taper 50 mg p.o. q.6 hours on the 5th of [name] November 2013, then taper x 10 mg daily until stopped. Nystatin 100,000 units in 150 oral suspension 5 mL every six hours. Fluticasone salmeterol  250/50, 1 puff twice a day. Ipratropium 18 mcg p.o. daily. Furosemide 20 mg p.o. every two days.  Patient is not to take Levaquin or  metoprolol. Home oxygen 5 liters of oxygen through nasal cannula. The patient's  oxygen should be advanced as needed to keep his oximetry at around 90 to 92%.   DIET: 2 grams salt, low fat, low cholesterol, mechanical soft.   ACTIVITY LIMITATIONS: As tolerated.   REFERRAL: Hospice at home.   FOLLOWUP: Follow-up appointment with Dr. Terance HartBronstein  in two days after discharge.  The patient discharged secondary to Dr. Terance HartBronstein.  CONSULTANTS:  Social work, Herbalistcare management, Palliative care.   RADIOLOGIC STUDIES: chest, portable single view 30th of 02/2012 showed interstitial infiltrate, edematous versus nonedematous, atelectasis  versus  consolidative infiltrate or possibly effusion right lung base and underlying mass, a nodule cannot be excluded. Surveillance evaluation is recommended CT scan of chest without contrast 03/21/2012 showed progressive mediastinal and subcarinal adenopathy. These could be malignant, progressive bilateral pulmonary infiltrates consistent with pneumonia and/or congestive heart failure. There is a right-sided pleural effusion, again noted, underlying malignancy in lung bases cannot be excluded. Coronary artery disease prior coronary artery bypass grafting and cardiomegaly was also noted. Repeated chest x-ray, portable single view 03/24/2012 showed slightly improved aeration of the lung bases. Decreased pleural effusion, persistent edema and basilar infiltrate versus atelectasis was also noted.   The patient is a 81 year old Caucasian male with past medical history significant for history of congestive heart failure, diastolic, history of recurrent pneumonias as well as history of chronic respiratory failure on oxygen at home and presented back to the hospital with complaints of shortness of breath. Please refer to Dr. Renae GlossWieting admission note on 03/21/2012. On arrival to the hospital, the patient's temperature 98.3, pulse 91, respiration rate 24, blood pressure 120/70's to 80s and later his blood pressure was even lower. He required nonrebreather  initially and his pulse oximetry was around 98% on nonrebreather. His physical exam revealed decreased breath sounds bilaterally. Poor air entrance, absent breath sounds in the right base.  He was admitted  to the hospital with acute respiratory failure requiring BiPAP. Initially it was  felt that patient very unlikely has possible health care associated pneumonia as well and he was started on Zosyn  as well as Levaquin. Because of hypotension. IV fluids were administered, however, as time progressed it appeared that the patient's disease was very likely a combination of congestive heart failure as well as possible underlying pneumonia. The patient was treated for pneumonia with Levaquin, his blood cultures came back negative. The patient's blood cultures taken on 03/21/2012 were negative. No growth. Sputum culture was taken on 03/24/2012 and that showed a normal flora at 48 hours. The patient's antibiotics were discontinued except the Levaquin. The patient was continued on Levaquin up until the day of discharge. He also was restarted on diuretics. With this therapy, he progressively improved. His oxygenation improved. Initially as mentioned above he required nonrebreather as well as BiPAP however, his oxygenation, improved and on the day of discharge she was back on 4 liters of oxygen through nasal cannula.   On 03/26/2012, the patient's temperature was 97.6, pulse was 81, respiratory rate 20, blood pressure 106/69. Saturation was 94% on 4 liters oxygen through nasal cannula. It was recommended for patient to continue his prednisone taper as well as inhalation therapy. He was also recommended to continue diuretics at home. It is recommended to advance patient's diuretics if needed. We were not able to advance his diuretics because of marginal, blood pressure as well as worsening kidney function. On the day of discharge patient's creatinine went up to 1.49. It is recommended to follow the patient's creatinine levels as outpatient and make decisions about medications, especially  diuretics depending upon those levels. However, it was felt that patient probably has underlying CKD and 1.4 to 1.5, creatinine is better likely patient's baseline   In regards to bronchial pneumonia. As mentioned above,  the patient was continued on antibiotic therapy, however, his sputum cultures were negative and  antibiotics were discontinued.  In regards to hypotension the patient's blood pressure medications were placed on hold as the patient was in getting back on diuretics.   For CKD,  initially on arrival to the hospital patient's creatinine was around 1.4. In fact it was 1.40 slightly improved with IV fluids in the hospital. However, when diuretics were restarted, the patient's creatinine returned back to 1.49 on the day of discharge 03/26/2012. It is recommended to follow the patient's creatinine levels as mentioned above, since he is back on diuretics.   For atrial fibrillation. The patient was continued on digoxin as well as Coumadin. The patient's pro time was elevated while he was in the hospital and the patient's Coumadin was placed on hold however, on 03/25/2012 the patient's pro time was 29.1 with INR level of 2.7. The patient  was advised to resume his Coumadin.   The patient was evaluated by palliative care because of his failure to thrive as well as underlying congestive heart failure and chronic obstructive pulmonary disease requiring high rate oxygen therapy. It was felt that he has underlying chronic respiratory failure and would benefit from hospice follow up at home. The patient as well as his family was agreeable  to  those arrangements and the patient will be discharged to home today on 03/26/2012 with hospice care.   Other medical problems such as coronary disease, irritable bowel syndrome as well as thrombocytopenia. Those were stable and no significant changes were made. The patient is being discharged in stable condition with above-mentioned medications  and follow-up.   TIME SPENT: 40 minutes.  ____________________________ Katharina Caper, MD rv:ljs D: 03/26/2012 16:10:36 ET T: 03/26/2012 17:17:41 ET JOB#: [id number]  cc: Katharina Caper, MD, <Dictator> Cybill Uriegas MD ELECTRONICALLY  SIGNED 04/06/2012 12:56

## 2014-09-09 NOTE — H&P (Signed)
PATIENT NAME:  John [name], John [name] MR#:  [id number] DATE OF BIRTH:  [birth year]  DATE OF ADMISSION:  03/21/2012  PRIMARY CARE PHYSICIAN: Will be Dr. Terance Hart who followed him over [facility]. They did set up a follow-up appointment upon discharge from [facility].   CHIEF COMPLAINT: Low oxygen saturation and difficulty breathing.   HISTORY OF PRESENT ILLNESS: This is an 81 year old man with multiple medical problems. He was recently in the hospital last month after having fluid accumulate. He had a thoracentesis and then required a chest tube after that and then was weak and was sent out to rehab. He is currently over at the rehab and sent in for the low oxygen saturation and difficulty breathing. Normally he wears 3 to 4 liters and he was desaturating on 4 liters. He has been coughing up lots of gobs of phlegm, mostly gray in nature, sometimes green. He is just not feeling well. The ER physician saw him and was going to place him on BiPAP. When I walked into the room the respiratory therapist was there. He was talking complete sentences to me while on 100% nonrebreather so we left him on the oxygen without the BiPAP at this point. Hospitalist services contacted for further evaluation.   PAST MEDICAL HISTORY:  1. Atrial fibrillation, on chronic anticoagulation. 2. History of congestive heart failure. 3. Coronary artery disease, status post CABG. 4. Chronic obstructive pulmonary disease and chronic respiratory failure on 4 liters oxygen. 5. History of pneumonia. 6. History of mass in the lung. 7. Irritable bowel syndrome. 8. He has a pilonidal cyst versus decubitus on the buttock. 9. Low platelets.  10. Chronic kidney disease.   PAST SURGICAL HISTORY:  1. Abdominal aortic aneurysm repair. 2. CABG. 3. Colon resection. 4. Cataracts. 5. Hernias.  6. Tonsils.   ALLERGIES: No known drug allergies.   MEDICATIONS:  1. Combivent 1 puff every six hours. 2. Digoxin 125 mcg daily.   3. Maxitrol ophthalmic ointment one application affected eye every other day. 4. Metoprolol 25 mg twice a day.  5. Tylenol extra strength 3 times a day.  6. Vitamin C 500 mg twice a day. 7. Warfarin 1 mg Monday, Tuesday, Thursday, Friday and Sunday. 8. Xanax 0.5 mg twice a day. 9. Also looks like he was started on Levaquin yesterday.   SOCIAL HISTORY: He is currently at rehab. Quit smoking 13 years ago. He drinks a scotch per day. He is a retired Therapist, sports.   FAMILY HISTORY: Father died in his mid 87s, unknown cause. Mother died at 18 after she was put into an institution. As per old chart it looks like she has a history of breast cancer.   REVIEW OF SYSTEMS: CONSTITUTIONAL: Positive for cold feeling, poor appetite. Positive for weight loss. No fever, chills, or sweats. EYES: He does have some right visual loss and draining from his eyes. EARS, NOSE, MOUTH, AND THROAT: Positive for runny nose. No sore throat. No difficulty swallowing. CARDIOVASCULAR: No chest pain. Occasional palpitations. RESPIRATORY: Positive for shortness of breath. Positive for cough, gray phlegm. No hemoptysis. GASTROINTESTINAL: No nausea. No vomiting. No abdominal pain. Sometimes constipation, sometimes diarrhea. No bright red blood per rectum. No melena. GENITOURINARY: No burning on urination. No hematuria. MUSCULOSKELETAL: Positive for joint pains and some buttock pain. INTEGUMENTARY: Positive for small area which the wife thinks is a pilonidal cyst that is painful. NEUROLOGIC: No fainting or blackouts. PSYCHIATRIC: No anxiety or depression. ENDOCRINE: No thyroid problems. HEMATOLOGIC/LYMPHATIC: History of thrombocytopenia.   PHYSICAL  EXAMINATION:  VITAL SIGNS: Vital signs when the patient presented: Temperature 98.3, pulse 91, respirations 24, blood pressure 123/78. Last vital signs included: Temperature 98.3, pulse 72, respirations 20, blood pressure 103/58, pulse oximetry 98% and that was on a nonrebreather.    GENERAL: Slight respiratory distress.   EYES: Conjunctivae and lids normal. Pupils equal, round, and reactive to light. Extraocular muscles intact. No nystagmus.   EARS, NOSE, MOUTH, AND THROAT: Tympanic membranes no erythema. Nasal mucosa no erythema. Throat no erythema. No exudate seen. Lips and gums no lesions.   NECK: No JVD. No bruits. No lymphadenopathy. No thyromegaly. No thyroid nodules palpated.   RESPIRATORY: Decreased breath sounds bilaterally. Poor air entry. Absent breath sounds right base.   CARDIOVASCULAR: S1, S2 normal. No gallops or rubs heard. 2/6 systolic ejection murmur. Carotid upstroke 2+ bilaterally. No bruits. Dorsalis pedis pulses 2+ bilaterally. 2+ edema bilateral lower extremity.   ABDOMEN: Soft, nontender. No organomegaly/splenomegaly. Normoactive bowel sounds. No masses felt.   LYMPHATIC: No lymph nodes in the neck.   MUSCULOSKELETAL: 2+ edema. No clubbing. No cyanosis.   SKIN: No ulcers anteriorly.   NEUROLOGIC: Cranial nerves II through XII grossly intact. Deep tendon reflexes 2+ bilateral lower extremities.   PSYCHIATRIC: Patient is oriented to person, place, and time.   LABORATORY, DIAGNOSTIC, AND RADIOLOGICAL DATA: EKG shows atrial fibrillation 85 beats per minute, low voltage. Troponin 0.06, white blood cell count 7.9, hemoglobin and hematocrit 11.8 and 35.7, platelet count 151, glucose 112, BUN 20, creatinine 1.4, sodium 140, potassium 3.9, chloride 97, CO2 36, calcium 8.6. Liver function tests normal. Albumin low at 2.5. Urinalysis negative. INR 2.9. Chest x-ray looks like pulmonary fibrosis, unclear if there is a superimposed pneumonia. There is a right pleural effusion.   ASSESSMENT AND PLAN:  1. Acute respiratory failure. The ER physician ordered BiPAP. I spoke with the respiratory tech and we are hoping to not do the BiPAP and get him over back to his usual nasal cannula and titrate him down from 100% nonrebreather. I will hold off on a gas and  BiPAP at this time. Patient is a DO NOT RESUSCITATE and does not want any aggressive intervention at this time.  2. Possible health care associated pneumonia, pulmonary fibrosis, history of lung mass and right pleural effusion. Will get aggressive with antibiotics for right now. Zyvox, Zosyn and Levaquin. Will get blood cultures and sputum culture. Pulmonary consult with Dr. Meredeth IdeFleming in the a.m. Will get a CT scan of the chest for further evaluation on what is going on in the lungs.  3. Hypotension. Will give a fluid bolus. Hold metoprolol for right now. Continue to monitor closely.  4. Atrial fibrillation. Patient is on digoxin. Will hold the metoprolol. I will get a digoxin level. Patient's INR is therapeutic. Will continue Coumadin.  5. Peripheral vascular disease.  6. History of congestive heart failure. Currently no signs, will continue current management.  7. Elevated troponin, most likely this is secondary to respiratory failure, not myocardial infarction. Aspirin given by the ER physician, will continue on a daily basis and check enzymes and monitor on telemetry.   TIME SPENT ON ADMISSION: 60 minutes.   CODE STATUS: Patient is a DO NOT RESUSCITATE.   ____________________________ Herschell Dimesichard J. Renae GlossWieting, MD rjw:cms D: 03/21/2012 17:51:17 ET T: 03/22/2012 05:25:05 ET JOB#: [id number]  cc: Herschell Dimesichard J. Renae GlossWieting, MD, <Dictator> Teena Iraniavid M. Terance HartBronstein, MD Salley ScarletICHARD J Audreyana Huntsberry MD ELECTRONICALLY SIGNED 03/25/2012 14:15

## 2014-09-09 NOTE — Discharge Summary (Signed)
PATIENT NAME:  John [name], John [name] MR#:  [id number] DATE OF BIRTH:  01/29/1925  DATE OF ADMISSION:  02/06/2012 DATE OF DISCHARGE:  02/10/2012  DISCHARGE DIAGNOSES:  1. Right pneumothorax post right thoracentesis.  2. Right pleural effusion, exudative based on total protein of 3.7. Cytology was negative.  3. Atrial fibrillation.  4. Nonsymptomatic hypertension.  5. Hyperlipidemia.  6. Coronary artery disease.  7. Status post abdominal aortic aneurysm repair.  8. DO NOT RESUSCITATE, NO CPR. 9.  ALLERGIES: No known drug allergies.  DISCHARGE MEDICATIONS: 1. Digoxin 1.25 mcg one tablet on Monday, Wednesday, and Friday.  2. Xanax 0.5 mg one tablet twice a day. 3. Lasix 20 mg daily if gain of 3 pounds. 4. Metoprolol 25 mg twice a day, hold for systolic pressure less than 110 or diastolic less than 60.  5. Warfarin 1 mg two tablets once a day. 6. Amiodarone 200 mg daily.  7. Combivent Respimat one puff every 6 hours. 8. Tylenol caplets extra-strength 500 mg one tablet three times daily p.r.n.  9.   4 liters [place] 02  Diet; low salt diet   DISCHARGE DISPOSITION: To home or to rehab if he qualifies.   HOSPITAL COURSE: The patient came in for thoracentesis of the right side due to a moderate-sized pleural effusion. As a consequence, he developed a right pneumothorax and had a pigtail placed by radiology and hence was admitted to the hospital for observation. The patient's subsequent chest x-ray showed a small pneumothorax and after two days at was finally clamped and did not drop overnight and on the evening of 02/08/2012 the chest tube was replaced by the surgeon, Dr. Katrinka BlazingSmith, who was following with us while he was in the hospital. Repeat chest x-ray on 02/09/2012 showed that the lung was still expanded and hence was deemed ready to be discharged. However, due to the three days of bedrest, the patient developed increased weakness and needs some physical therapy. Hence, physical therapy was consulted and  if he is deemed to be a candidate for rehab he will be sent versus going home with home health and physical therapy. He is not having any chest pain as a result of the pneumothorax. His site where the chest tube was placed has closed off quite nicely. He had a bout of hypotension while here. [facility], he runs blood pressures between 90 to 110. He was dizzy at one time when blood pressure was below 80. He was given IV fluids for hydration and subsequently said he felt better. Today his systolic pressures are running between 80 to 90. This I think is another reason for rehab and for close observation in case he needs to be hydrated some more. He is expected to continue the above drug regimen. He is able to restart his Coumadin tonight. He is expected to followup with pulmonary in 7 to 14 days.  ____________________________ Clenton PareHerbon E. Meredeth IdeFleming, MD hef:slb D: 02/09/2012 13:37:00 ET T: 02/09/2012 16:11:20 ET JOB#: [id number]  cc: Herbon E. Meredeth IdeFleming, MD, <Dictator> Mertie MooresHERBON E FLEMING MD ELECTRONICALLY SIGNED 02/09/2012 16:49

## 2014-09-14 NOTE — H&P (Signed)
PATIENT NAME:  John [name], John [name] MR#:  [id number] DATE OF BIRTH:  07/29/1924  DATE OF ADMISSION:  11/22/2011  REFERRING PHYSICIAN: Dr. Dolores Frame  PRIMARY CARE PHYSICIAN:  Dr. Estanislado Emms     CHIEF COMPLAINT: Shortness of breath.   HISTORY OF PRESENT ILLNESS: This is an 81 year old male with significant past medical history of coronary artery disease status post coronary artery bypass grafting, history of arterial fibrillation, hypertension, congestive heart failure, history of chronic obstructive pulmonary disease, and chronic renal insufficiency who presents with complaint of shortness of breath. The patient's wife reports he has been feeling severely short of breath. His extremities became cyanotic so they called the paramedics where he was started on BiPAP and his symptoms much improved. The patient was hypotensive, which was worsened by him being on BiPAP. The patient's x-ray did show significant right pleural effusion with evidence of vascular congestion and mild pulmonary edema. The patient had elevated pro BNP. The patient's pro BNP was 6800. As well, the patient was found to be in acute renal failure with creatinine of 1.75 with baseline creatinine 1.07. The patient had leukocytosis of 13.6. He denies any fever, any cough, or any dysuria. The patient's urinalysis was negative. The patient was found to be in atrial fibrillation with RVR with heart rate in the 120s.  His troponin was 0.21 but he denies any chest pain. The  patient reports he is on Lasix p.r.n. where he weighs himself daily and if he gains 3 pounds then he gets 20 of p.o. Lasix. He has not taken any Lasix over the last three days as his weight was stable.  The patient is on warfarin for his atrial fibrillation with INR level  3.6. Secondary to the patient's BiPAP the patient's systolic blood pressure dropped in the 80s to the mid 50s. He was switched to Ventimask, which he tolerated okay and his systolic blood pressure came up to the mid  70s.   PAST MEDICAL HISTORY:  1. Atrial fibrillation on warfarin.  2. Congestive heart failure. No recent echo. Unknown ejection fraction.  3. History of chronic obstructive pulmonary disease not on home oxygen.  4. Chronic renal insufficiency.  5. Coronary artery disease status post CABG.  6. Irritable bowel.  7. Lung mass, which is being followed by oncology service, Dr. Lorre Nick.  PAST SURGICAL HISTORY:  1. Coronary artery bypass graft.  2. AAA repair.  3. Colon resection.   HOME MEDICATIONS:  1. Alprazolam 0.5 mg oral daily.  2. Digoxin 0.125 mcg oral Monday, Wednesday, Friday.  3. Lovastatin 40 mg daily.  4. Metoprolol 25 mg, 1.5 tablets 2 times a day.  5. Warfarin 2 mg Monday, Wednesday, Friday, Sunday, 3 mg Tuesday, Thursday, and Saturday.  6. P.r.n. Lasix 20 mg for weight gain of 3 pounds daily.   ALLERGIES:  No drug allergies.   SOCIAL HISTORY: The patient is married, lives with his wife at home. No children. 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 He smoked 1 pack a day for 50 years, quit 14 years ago. No drugs or alcohol.   FAMILY HISTORY: Father died at the age of 27 of cerebrovascular accident.  Mother died at the age of 81 of breast cancer.   REVIEW OF SYSTEMS:  The patient denies any fever. Complains of generalized weakness and fatigue. EYES: Denies blurry vision, double vision, or pain. ENT: Denies tinnitus, ear pain, or hearing loss.  RESPIRATORY: Denies any cough, wheezing, or hemoptysis. Complains of shortness of breath.  Has history of chronic obstructive pulmonary disease. Denies any pleuritic pain. CARDIOVASCULAR: Denies any chest pain or orthopnea. Has worsening lower extremity edema. Denies any syncope. GI: Denies nausea, vomiting, diarrhea, or abdominal pain. Has constipation. GU: Denies dysuria, hematuria, or renal colic. ENDO: Denies polyuria, polydipsia, heat or cold intolerance. HEMATOLOGY: Denies any anemia, easy bruising on anticoagulation  for atrial fibrillation. INTEGUMENT: Denies any acne, rash, or itching. MUSCULOSKELETAL: Denies any neck pain, back pain, or gout. NEURO: Denies any numbness, dysarthria, epilepsy, or tremors. PSYCH: Denies any insomnia or bipolar disorder.   PHYSICAL EXAMINATION:  VITAL SIGNS: Temperature 98.1, pulse 110, respiratory rate 28, blood pressure 75/48, saturating 90% on nonrebreather.   GENERAL: Frail, ill-appearing, elderly male in bed.   HEENT: Head atraumatic, normocephalic. Pupils equal, reactive to light. Pink conjunctivae. Moist oral mucosa.   NECK: Supple. Jugular venous distention 8 to 10 cm.   CHEST: The patient had fair air entry bilaterally. Decreased air entry on the right side. Bilateral crackles.   CARDIOVASCULAR: S1 and S2 heard. Irregularly irregular. Tachycardic.   ABDOMEN: Soft, nontender, nondistended. Bowel sounds present.   EXTREMITIES: +2 bilateral edema.   PSYCHIATRIC: Appropriate affect. Awake, alert, oriented times 3. Intact judgment and insight. The patient is coherent, fully understanding of his medical condition and prognosis.   NEURO: Cranial nerves grossly intact. Motor five out of five.   SKIN: No rash. He has chronic lower extremity venous changes and skin discoloration.   PERTINENT LABORATORY DATA:  Glucose 165, BNP 6800, BUN 31, creatinine 1.75, sodium 137, potassium 5.4, chloride 101, CO2 29, troponin 0.21, digoxin 0.8. White blood cells 15.6, hemoglobin 13.9, hematocrit 43.4, platelets 135, INR 3.6. Urinalysis negative. ABG: pH 7.34, pCO2 51, pO2 121. Chest x-ray showing significant right-sided pleural effusion with pulmonary congestion and chronic obstructive pulmonary disease picture.   ASSESSMENT AND PLAN:  1. This is an 81 year old male with significant past medical history of atrial  fibrillation, congestive heart failure, coronary artery disease, and chronic obstructive pulmonary disease who presents with complaints of dyspnea.  His case and prognosis  were discussed at length with the patient and his wife. The patient has a good understanding of his medical condition as well as his wife, who is a retired Engineer, civil (consulting)nurse. The patient has realistic expectations and he wishes to remain DO NOT RESUSCITATE, DO NOT INTUBATE.  Discussed with the patient the goals of care and how aggressive he wants us to be in his medical management. The patient understands most of his conditions are irreversible and he wants conservative measures; at this point he does not want to have any central line insertion or any vasopressors started, but he is okay to continue with the other forms of medical management including IV fluids, antibiotics, oxygen, and BiPAP if needed, but he reports if his condition deteriorates he does not want to escalate his care.  His wife is at the bedside who is his power of attorney and she agrees with the plan. As well, his dyspnea might be secondary to chronic obstructive pulmonary disease so we will continue the patient on nebulizer treatments. His right pleural effusion contributing to his respiratory failure, which has been progressing over the last month, might be secondary to possible malignancy.  2. Acute respiratory failure. This might be multifactorial, most likely secondary to congestive heart failure exacerbation as having elevated pro BNP with right pleural effusion with pulmonary congestion. Unfortunately, the patient cannot be diuresed at this point secondary to his acute renal failure and hypotension, so  we will continue him on oxygen via nonrebreather and if needed we will put him on BiPAP.  3. Hypotension. This is most likely due to cardiogenic and to much lesser degree secondary to sepsis. The patient only has leukocytosis, is afebrile, has no cough, has no dysuria. As well cannot continue the patient on IV fluids given the fact of his respiratory failure due to volume overload and the patient at this point does not want any central line insertion  or pressors so we will monitor closely. We will hold his metoprolol and p.r.n. Lasix. Even though sepsis is unlikely we will obtain two sets of blood cultures.  Due to right pleural effusion, pneumonia cannot be ruled out so we will start the patient on antibiotic coverage empirically. We will start on IV vancomycin and Zosyn. We will await septic work-up.  4. Leukocytosis. The patient will be started on vancomycin and Zosyn empirically. Septic work-up was sent.  5. Atrial fibrillation with rapid ventricular response. The patient's digoxin level is 0.8. We will reload him with digoxin for better heart rate control. We will avoid Cardizem and metoprolol secondary to hypotension.  6. Coagulopathy, INR of 3.6. The patient is on warfarin. We will hold warfarin at this point and will resume if he improves.  7. Thrombocytopenia, chronic, stable.  8. History of lung mass. The patient has been followed by oncology, Dr. Lorre Nick, with last followup in May of this year. 9.  The patient had troponin of 0.21. The patient denies any chest pain. This is most likely   due to demand ischemia from atrial fibrillation with RVR and secondary to dyspnea. We will cycle cardiac enzymes. The patient was given 162 of aspirin in the Emergency Department. We will continue him on baby aspirin daily.  10.Hyperkalemia. The patient was given one dose of Kayexalate.   11.. We will continue with Protonix for GI prophylaxis. The patient's INR is 3.6 so no need for deep vein thrombosis prophylaxis at this point.  10. CODE STATUS: DO NOT RESUSCITATE/DO NOT INTUBATE.   The case and CODE STATUS were discussed at length with the patient and his wife and he has great understanding of his medical condition and they understand this might be terminal, so for the time being they only want to continue with conservative management, nothing aggressive, no lines, no pressors at this point.   TOTAL TIME SPENT ON PATIENT CARE: 65 minutes.     ____________________________ Starleen Arms, MD dse:bjt D: 11/22/2011 02:50:23 ET T: 11/22/2011 08:37:05 ET JOB#: [id number]  cc: Starleen Arms, MD, <Dictator> Dr. Estanislado Emms Maecy Podgurski Teena Irani MD ELECTRONICALLY SIGNED 11/23/2011 6:53

## 2014-09-14 NOTE — H&P (Signed)
PATIENT NAME:  John [name], John [name] MR#:  [id number] DATE OF BIRTH:  27-Sep-1924  DATE OF ADMISSION:  11/22/2011  ADDENDUM:  ASSESSMENT AND PLAN:  Elevated troponin. The patient had troponin of 0.21. The patient denies any chest pain. This is most likely due to demand ischemia from atrial fibrillation with RVR and secondary to dyspnea. We will cycle cardiac enzymes. The patient was given 162 of aspirin in the Emergency Department. We will continue him on baby aspirin daily.  Hyperkalemia. The patient was given one dose of Kayexalate.    ____________________________ Starleen Armsawood S. Elgergawy, MD dse:bjt D: 11/22/2011 03:07:23 ET T: 11/22/2011 09:11:53 ET JOB#: [id number]  cc: Starleen Armsawood S. Elgergawy, MD, <Dictator> Dr. Estanislado Emmsarleton Miller DAWOOD Teena IraniS ELGERGAWY MD ELECTRONICALLY SIGNED 11/23/2011 6:53

## 2014-09-14 NOTE — Discharge Summary (Signed)
PATIENT NAME:  John [name], John [name] MR#:  [id number] DATE OF BIRTH:  1924/11/21  DATE OF ADMISSION:  11/22/2011 DATE OF DISCHARGE:  11/30/2011  PRIMARY CARE PHYSICIAN: Leeann Must, MD  CONSULTANTS:  1. Erin Fulling, MD. 2. Marcina Millard, MD.  DISCHARGE DIAGNOSES:  1. Acute on chronic respiratory failure.  2. Congestive heart failure, acute on chronic diastolic dysfunction.  3. Atrial fibrillation.  4. Hypotension, resolved.  5. Acute renal failure and dehydration.  6. Leukocytosis.  7. Chronic obstructive pulmonary disease. 8. Coagulopathy. 9. Coronary artery disease.  10. Heart failure.   CONDITION: Stable.   HOME MEDICATIONS:  1. Digoxin 125 mcg p.o. on Monday, Wednesday, and Friday.  2. Alprazolam 0.5 mg p.o. twice a day. 3. Lovastatin 40 mg p.o. at bedtime.  4. Coumadin 2 mg every Tuesday, Thursday, and Saturday; 3 mg p.o. Monday, Wednesday, and Friday.  5. Lasix 20 mg p.o. 1 tablet p.r.n. if gain of 3 pounds above his baseline.  ADDITIONAL MEDICATIONS: Stop Lopressor 25 mg p.o. tablet 1-1/2 tablets p.o. twice a day. Change to Lopressor 12.5 mg p.o. twice a day. Hold if systolic blood pressure less than 110 or diastolic less than 60.   HOME OXYGEN: The patient needs home oxygen at 3 to 4 liters by nasal cannula.   DIET: Low sodium diet.   ACTIVITY: As tolerated.   FOLLOWUP CARE: Followup with PCP within one week. The patient also needs a followup INR. The patient needs home health and physical therapy. In addition, the patient needs to followup with Dr. Ned Clines and Dr. Darrold Junker as an outpatient.   CODE STATUS: DO NOT RESUSCITATE.   REASON FOR ADMISSION: Shortness of breath.   HOSPITAL COURSE:  1. The patient is an 81 year old Caucasian male with a history of congestive heart failure and chronic obstructive pulmonary disease on home oxygen, chronic kidney disease, coronary artery disease, and lung mass followed by oncology Dr. Lorre Nick who presented with  complaints of shortness of breath. The patient's extremities became cyanotic so his wife called paramedics where he was started on BiPAP and his symptoms much improved. The patient was hypotensive which was worsened by BiPAP. The patient's x-ray showed significant right pleural effusion with evidence of vascular congestion and mild pulmonary edema. The patient's BNP was 6800, creatinine was 1.7, and WBC was 13.6. In addition, the patient was found to have atrial fibrillation with RVR at 120s. Troponin was 0.21. INR was 3.6. He was admitted for acute on chronic respiratory failure most likely secondary to congestive heart failure with congestive heart failure decompensation with chronic obstructive pulmonary disease exacerbation. For a detailed history and physical examination, please refer to the admission note dictated by Dr. Huey Bienenstock. Initially the patient was treated with BiPAP, nebulizer p.r.n. and Solu-Medrol for respiratory failure and chronic obstructive pulmonary disease exacerbation. Also the patient was treated with antibiotics with Zosyn and vancomycin due to leukocytosis and suspect of pneumonia. However, the patient has had no fever or chills and leukocytosis improved so antibiotics were discontinued. The patient was off BiPAP and on oxygen by nasal cannula.  2. For congestive heart failure, since the patient has hypotension, we cannot start Lasix. However, the patient's symptoms have improved since admission and BNP decreased to 27,000.  3. Atrial fibrillation with rapid ventricular response. The patient has been treated with digoxin and low dose Coreg. Heart rate has been controlled.  4. Coagulopathy. Initially the patient had an INR of 3.6 so Coumadin was on hold. Then since the patient's  INR decreased to about 2 to 3 Coumadin was resumed. Today INR is 1.6.  5. Acute on chronic respiratory failure. As mentioned above, the patient's symptoms have been improving, however, the patient  develops decreased oxygen saturation whenever he walks around with oxygen. The patient has no complaints of shortness of breath, cough or sputum. Lung sounds are clear. No extremity edema. We consulted Dr. Belia HemanKasa again. He recommended the patient needs to be on 3 to 4 liters by nasal cannula and can be discharged with home oxygen.   The patient is being discharged to home today with home oxygen, home health, and physical therapy. I discussed the patient's situation and the discharge plan, Dr. Belia HemanKasa, the nurse, and the case manager.   TIME SPENT: About 50 minutes. ____________________________ Shaune PollackQing Aubry Tucholski, MD qc:slb D: 11/30/2011 16:56:28 ET T: 12/01/2011 10:28:31 ET JOB#: [id number]  cc: Shaune PollackQing Charle Clear, MD, <Dictator> Leeann Mustarlton Miller, MD Shaune PollackQING Dreana Britz MD ELECTRONICALLY SIGNED 12/01/2011 14:52

## 2014-09-14 NOTE — Consult Note (Signed)
PATIENT NAME:  John [name], John [name] MR#:  [id number] DATE OF BIRTH:  January 18, 1925  DATE OF CONSULTATION:  11/22/2011  CONSULTING PHYSICIAN:  Marcina Millard, MD PRIMARY CARDIOLOGIST: Youlanda Mighty, MD at [facility]   CHIEF COMPLAINT: "I feel awful."   HISTORY OF PRESENT ILLNESS: The patient is an 81 year old gentleman who presents with respiratory failure, likely multifactorial, secondary to congestive heart failure, chronic obstructive pulmonary disease, right pleural effusion with an apparent lung mass. The patient denies chest pain. He has a history of coronary artery disease, status post prior bypass graft surgery, followed by Dr. Youlanda Mighty at [facility]. Upon presentation, the patient was in atrial fibrillation with a rapid rate, thought the rate has improved since hospitalization. The admission labs were notable for elevated troponin of 1.66 in the absence of chest pain. EKG did not reveal any acute ischemic ST-T wave changes.   PAST MEDICAL HISTORY:  1. Status post coronary artery bypass graft surgery.  2. History of atrial fibrillation.  3. Chronic obstructive pulmonary disease. 4. Right pleural effusion associated with lung mass, followed by Dr. Lorre Nick.  5. Chronic kidney disease   MEDICATIONS ON ADMISSION:  1. Warfarin 2 mg Tuesday, Thursday, Saturday, Sunday, 3 mg Wednesday and Friday.  2. Digoxin 0.125 mg daily.  3. Metoprolol tartrate 25 mg b.i.d.  4. Lovastatin 40 mg at bedtime.  5. Lasix 20 mg p.r.n.  6. Alprazolam 0.5 mg b.i.d.   SOCIAL HISTORY: The patient currently lives with his wife. He quit tobacco abuse in 1999.   FAMILY HISTORY: No immediate family history of coronary disease or myocardial infarction.   REVIEW OF SYSTEMS: CONSTITUTIONAL: No fever or chills. EYES: No blurry vision. EARS: No hearing loss. RESPIRATORY: Shortness of breath as described above. CARDIOVASCULAR: No chest pain. GI: The patient does have  intermittent diarrhea due to IBS. GU: No dysuria or hematuria. ENDOCRINE: No polyuria or polydipsia. MUSCULOSKELETAL: No arthralgias or myalgias. NEUROLOGICAL: No focal muscle weakness or numbness. PSYCHOLOGICAL: No depression or anxiety.   PHYSICAL EXAMINATION:  VITAL SIGNS: Blood pressure 99/58, pulse 99, respirations 25, temperature 98.2, pulse oximetry 93%.   HEENT: Pupils are equal, reactive to light and accommodation.   NECK: Supple without thyromegaly.   LUNGS: Decreased breath sounds in both bases.   HEART: Normal jugular venous pressure. Diffuse point of maximal impulse. Irregular, irregular rhythm. Normal S1, S2. No appreciable gallop, murmur, or rub.   ABDOMEN: Soft and nontender. Pulses were intact bilaterally.   MUSCULOSKELETAL: Normal muscle tone.   NEUROLOGICAL: The patient is alert and oriented x3. Motor and sensory are both grossly intact   IMPRESSION: An 81 year old gentleman who presents with respiratory failure, likely multifactorial secondary to congestive heart failure, chronic obstructive pulmonary disease, present right pleural effusion, elevated white count, and some hypertension which also is suggestive of possible underlying sepsis. The patient initially had atrial fibrillation with a rapid ventricular response, although the rate at present appears to be controlled. The patient denies chest pain. He does have borderline elevated troponin which is likely due to demand-supply ischemia in the absence of chest pain.   RECOMMENDATIONS:  1. Agree with overall current therapy.  2. Continue digoxin for rate control and resume metoprolol once blood pressure is stabilized.  3. Would defer full-dose anticoagulation at this time.  4. Review 2-D echocardiogram.  5. Further recommendations pending echocardiogram results and the patient's initial clinical course.   ____________________________ Marcina Millard, MD ap:cbb D: 11/22/2011 08:38:04 ET T: 11/22/2011 12:25:53  ET  JOB#: [id number]  cc: Marcina MillardAlexander Chena Chohan, MD, <Dictator> Marcina MillardALEXANDER Harper Vandervoort MD ELECTRONICALLY SIGNED 12/01/2011 8:37

## 2014-09-14 NOTE — Consult Note (Signed)
PATIENT NAME:  John [name], John [name] MR#:  [id number] DATE OF BIRTH:  Sep 29, 1924  DATE OF CONSULTATION:  05/12/2011  REFERRING PHYSICIAN:   CONSULTING PHYSICIAN:  Knute Neu. Lorre Nick, MD  HISTORY OF PRESENT ILLNESS: John [name] is an 81 year old patient who was admitted on 05/11/2011, and whom I consulted on 05/12/2011, with a short consult note placed in the chart at that time, with the full dictation delayed and rendered at this time.   The patient was admitted with hemoptysis and a lung mass, I discussed the case with the emergency room physician and attending physician who admitted him on that evening, 05/11/2011, and saw the patient for full evaluation the next day. The patient was admitted, his Coumadin was held, and he was scheduled for a PET scan to further work-up abnormal findings seen on a chest x-ray and CAT scan, specifically a mass in the right lung, abdominal diaphragm, and pleural effusion on the right greater than left. In addition the patient had had hemoptysis for about two months, he lost about 5 or 6 pounds with a decreased appetite, and he did not have any chest pain or increased shortness of breath or fevers or chills or sweats. He has some chronic irritable bowel syndrome alternating with diarrhea and constipation. He had no headache or new bone pain. In addition he denied any cognitive or memory changes, ear or jaw pain, and no new palpitations. He has a history of atrial fibrillation, no new abdominal pain, no early satiety must loss of appetite, no vomiting or diarrhea, no extremity edema, no other new back or bone or joint pain, and no rash, bruising, pruritus, or focal weakness.   PAST MEDICAL HISTORY:  1. Atrial fibrillation. 2. Congestive heart failure. 3. Chronic obstructive pulmonary disease. 4. Chronic renal insufficiency. 5. Irritable bowel syndrome.  6. Coronary artery disease. 7. Prior bypass graft. 8. Prior AAA repair.  9. Prior colon resection which was for nonmalignant  disease, according to the patient.   MEDICATIONS:  1. Digoxin daily. 2. Coumadin 3 mg four days a week and 2 mg three days a week.  3. Lovastatin 40 mg daily.  4. Metoprolol 37.5 mg twice a day. 5. Alprazolam 0.5 mg twice a day.  DRUG ALLERGIES: No known drug allergies.   FAMILY HISTORY: Mother had breast cancer.   SOCIAL HISTORY: He smoked a pack a day for 50 years, quit 13 years ago.   PHYSICAL EXAMINATION:   GENERAL: He was alert and cooperative, some pallor, no acute distress, no thrush.   NECK: No mass in the neck.   LYMPH: No palpable lymph nodes in the neck, supraclavicular, submandibular, or axilla.   HEART: Systolic murmur was noted.   LUNGS: Decreased air entry in all lung fields with possibly some dullness at the extreme right base. No use of accessory muscles. No wheezing or rales.   ABDOMEN: Nontender. No palpable mass or organomegaly. He had an old scar.   EXTREMITIES: No extremity edema.   NEUROLOGIC: Grossly nonfocal. Moving all extremities.   MUSCULOSKELETAL: Strength seemed equal and symmetrical, although he was in the bed and I did not test his gait.   SKIN: Some bruising on the forearms. No significant bruises.   PSYCH: Mood and affect were unremarkable.   LABS/STUDIES: Creatinine was 1.3. Hemoglobin 14.9 and platelets 111,000. PT/INR 2.6. White count was 7.3.   A CT scan of the chest revealed abnormal findings, these were also reviewed in tumor conference. He has a pleural effusion on the right,  there is a lesion with some punctate calcifications on the dome of the diaphragm, there is a lesion in the lingula consistent with possible tumor or pneumonia, no definite or enlarged mediastinal or hilar nodes, with later PET scan done late that evening.   IMPRESSION AND PLAN: The patient is with prior history of smoking and abnormal findings on scan, although the mass on the dome of the diaphragm might be due to pneumonia eight years ago with the patient  having a history that his chest x-ray has always been slightly abnormal over many years since that incident as a child. He has inflammatory changes in the lungs and he may have postobstructive pneumonia in the lingula. Hemoptysis for two months is certainly suspicious for malignancy, and he has been on Coumadin, however he also reports that previously with a course of antibiotics hemoptysis stopped and then recurred. The CT and the history clearly suggested need for PET scan which was done later in the evening, that report was reviewed, and when combining the PET scan findings with the CT findings and clinical presentation, I have recommended to the patient watchful waiting. It was also reviewed later in the followup note.  There is no lesion that is easily accessible for biopsy. There is no lesion with a high yield for result. The pleural effusion is more likely not malignant and the yield diagnosed from pleural fluid is not high and if such were obtained would probably be too scanty material for molecular studies. I would not biopsy the left, which looks like pneumonia at this time. There was no evidence of metastatic disease or any obviously malignant lymph nodes on the PET scan. There is no obvious endobronchial lesion or central lesion obviously responsible for or at high risk for significant hemoptysis. The patient's treatment options, if or when a cancer were found, would be limited to palliative radiation or targeted therapy but patient is not a candidate nor does he desire otherwise aggressive systemic chemotherapy.   The patient's Coumadin is currently on hold with hemoptysis, with stroke risk and atrial fibrillation and the patient's concerns in the absence of significant hemoptysis, it would be prudent to allow him to continue later on a lower dose of Coumadin watching the clinical course. I would favor treating with antibiotics for what appears to be, or at least is possibly a postobstructive lingular  pneumonia.  The patient has mild thrombocytopenia. No evidence of obvious gross bleeding or consumption. No evidence of prior infection. This could be response infection or the patient could have chronic mild thrombocytopenia due to low marrow reserve. I would look to obtain old records from primary care. The patient could have myelodysplasia or low-grade idiopathic thrombocytopenia purpura, but platelets are adequate and so no other measures are needed. There was no sign of lymphoma or enlarged spleen on the PET scan. Platelet count needs to be watched in conjunction with clinical follow-up and close monitoring of the pro time.   SUMMARY AND PLAN: The patient needs follow-up, but no biopsy or aggressive intervention at this time. He can continue Coumadin with a lower target INR of 2.0, but he would need to stop immediately if hemoptysis were to recur with fresh blood amounting to  more than a teaspoon in 24 hours. The patient has to be watched for platelet count and consider further work-up, intervention, or possibly steroids. Planning repeat chest x-ray to watch pleural effusion and planning a repeat CT scan in two months if the patient was to remain  stable. Planning to proceed onto a bronchoscopy earlier than that if there is changing clinical status or persistent hemoptysis or signs and symptoms of infection persist. The patient would not need to be an inpatient for further oncology evaluation.  ____________________________ Knute Neuobert G. Lorre NickGittin, MD [id number] D: 05/13/2011 22:19:13 ET T: 05/14/2011 09:27:33 ET JOB#: [id number]  cc: Knute Neuobert G. Lorre NickGittin, MD, <Dictator> Marin RobertsOBERT G Cinda Hara MD ELECTRONICALLY SIGNED 06/03/2011 9:02
